# Patient Record
Sex: Female | Born: 1980
Health system: Southern US, Community
[De-identification: ages and names within clinical notes are randomized; demographics above are authoritative.]

## PROBLEM LIST (undated history)

## (undated) DIAGNOSIS — N200 Calculus of kidney: Secondary | ICD-10-CM

## (undated) DIAGNOSIS — IMO0002 Reserved for concepts with insufficient information to code with codable children: Secondary | ICD-10-CM

## (undated) DIAGNOSIS — F329 Major depressive disorder, single episode, unspecified: Secondary | ICD-10-CM

## (undated) DIAGNOSIS — N390 Urinary tract infection, site not specified: Secondary | ICD-10-CM

## (undated) DIAGNOSIS — F988 Other specified behavioral and emotional disorders with onset usually occurring in childhood and adolescence: Secondary | ICD-10-CM

## (undated) DIAGNOSIS — R87619 Unspecified abnormal cytological findings in specimens from cervix uteri: Secondary | ICD-10-CM

## (undated) DIAGNOSIS — I8390 Asymptomatic varicose veins of unspecified lower extremity: Secondary | ICD-10-CM

## (undated) DIAGNOSIS — F32A Depression, unspecified: Secondary | ICD-10-CM

## (undated) HISTORY — DX: Asymptomatic varicose veins of unspecified lower extremity: I83.90

## (undated) HISTORY — PX: COLPOSCOPY: SHX161

---

## 2011-02-05 ENCOUNTER — Encounter: Payer: Self-pay | Admitting: *Deleted

## 2011-02-05 ENCOUNTER — Emergency Department (HOSPITAL_COMMUNITY)
Admission: EM | Admit: 2011-02-05 | Discharge: 2011-02-05 | Disposition: A | Discharge status: Home / Self Care | Payer: Medicaid Other | Attending: Emergency Medicine | Admitting: Emergency Medicine

## 2011-02-05 DIAGNOSIS — R059 Cough, unspecified: Secondary | ICD-10-CM

## 2011-02-05 DIAGNOSIS — R05 Cough: Secondary | ICD-10-CM

## 2011-02-05 DIAGNOSIS — L299 Pruritus, unspecified: Secondary | ICD-10-CM | POA: Insufficient documentation

## 2011-02-05 DIAGNOSIS — B86 Scabies: Secondary | ICD-10-CM | POA: Insufficient documentation

## 2011-02-05 DIAGNOSIS — Z79899 Other long term (current) drug therapy: Secondary | ICD-10-CM | POA: Insufficient documentation

## 2011-02-05 MED ORDER — PERMETHRIN 5 % EX CREA: TOPICAL_CREAM | CUTANEOUS | Status: AC

## 2011-02-05 MED ORDER — BENZONATATE 200 MG PO CAPS: 200.0000 mg | ORAL_CAPSULE | Freq: Three times a day (TID) | ORAL | Status: AC

## 2011-02-05 NOTE — ED Notes (Signed)
Pt states that she was working with a coworker who had scabies, was exposed for two weeks, started itching to both hands and ankles area a few days ago, also c/o cough, pt states that she was sick with the flu last week, cough was productive but has changed to non productive dry cough now.

## 2011-02-06 NOTE — ED Provider Notes (Signed)
History     CSN: [id number]  Arrival date & time 02/05/11  1115   First MD Initiated Contact with Patient 02/05/11 1227      Chief Complaint  Patient presents with  . Rash  . Cough    (Consider location/radiation/quality/duration/timing/severity/associated sxs/prior treatment) Patient is a 31 y.o. female presenting with rash and cough. The history is provided by the patient.  Rash  This is a new problem. The current episode started more than 2 days ago. The problem has not changed since onset.Associated with: Patient recently discovered a coworker had scabies,  and pt has been exposed to her for the past 2 weeks at work. There has been no fever. The rash is present on the left wrist, right wrist, right fingers, left fingers, right ankle and left ankle. The pain is at a severity of 0/10. The patient is experiencing no pain. The pain has been constant since onset. Associated symptoms include itching. She has tried nothing for the symptoms.  Cough This is a new problem. The current episode started more than 1 week ago. The problem occurs every few minutes. The problem has not changed since onset.The cough is non-productive (she reports having the "flu" last week,  her symptoms have resolved except for continued dry cough.). Pertinent negatives include no chest pain, no headaches, no rhinorrhea, no sore throat, no myalgias, no shortness of breath and no wheezing. She has tried cough syrup for the symptoms. The treatment provided no relief. She is not a smoker.    History reviewed. No pertinent past medical history.  History reviewed. No pertinent past surgical history.  History reviewed. No pertinent family history.  History  Substance Use Topics  . Smoking status: Former Games developer  . Smokeless tobacco: Not on file  . Alcohol Use: No    OB History    Grav Para Term Preterm Abortions TAB SAB Ect Mult Living                  Review of Systems  Constitutional: Negative for fever.    HENT: Negative for congestion, sore throat, rhinorrhea and neck pain.   Eyes: Negative.   Respiratory: Positive for cough. Negative for chest tightness, shortness of breath and wheezing.   Cardiovascular: Negative for chest pain.  Gastrointestinal: Negative for nausea and abdominal pain.  Genitourinary: Negative.   Musculoskeletal: Negative for myalgias, joint swelling and arthralgias.  Skin: Positive for itching and rash. Negative for wound.  Neurological: Negative for dizziness, weakness, light-headedness, numbness and headaches.  Hematological: Negative.   Psychiatric/Behavioral: Negative.     Allergies  Latex  Home Medications   Current Outpatient Rx  Name Route Sig Dispense Refill  . BENZONATATE 200 MG PO CAPS Oral Take 1 capsule (200 mg total) by mouth every 8 (eight) hours. 20 capsule 0  . PERMETHRIN 5 % EX CREA  Apply to affected area once 60 g 1    BP 108/46  Pulse 77  Temp 98.1 F (36.7 C)  Resp 20  Ht 5\' 6"  (1.676 m)  Wt 129 lb (58.514 kg)  BMI 20.82 kg/m2  SpO2 98%  LMP 02/04/2011  Physical Exam  Nursing note and vitals reviewed. Constitutional: She is oriented to person, place, and time. She appears well-developed and well-nourished.  HENT:  Head: Normocephalic and atraumatic.  Eyes: Conjunctivae are normal.  Neck: Normal range of motion.  Cardiovascular: Normal rate, regular rhythm, normal heart sounds and intact distal pulses.   Pulmonary/Chest: Effort normal and breath sounds normal.  She has no wheezes.  Abdominal: Soft. Bowel sounds are normal. There is no tenderness.  Musculoskeletal: Normal range of motion.  Neurological: She is alert and oriented to person, place, and time.  Skin: Skin is warm and dry. Rash noted. Rash is papular.       Scattered papules, few in linear pattern on hands,  Finger webs,  Bilateral ankles and popliteal space bilateral.  Extension excoriation with scabbing present,  Particularly between fingers.  No drainage,  No  erythema or edema noted.  Psychiatric: She has a normal mood and affect.    ED Course  Procedures (including critical care time)  Labs Reviewed - No data to display No results found.   1. Scabies   2. Cough       MDM   Permethrin.  Shaune Pascal, [place] 02/06/11 [phone]

## 2011-02-07 NOTE — ED Provider Notes (Signed)
Medical screening examination/treatment/procedure(s) were performed by non-physician practitioner and as supervising physician I was immediately available for consultation/collaboration.   Joya Gaskins, MD 02/07/11 [phone]

## 2011-09-26 ENCOUNTER — Encounter (HOSPITAL_COMMUNITY): Payer: Self-pay | Admitting: *Deleted

## 2011-09-26 ENCOUNTER — Inpatient Hospital Stay (HOSPITAL_COMMUNITY)
Admission: AD | Admit: 2011-09-26 | Discharge: 2011-09-26 | Disposition: A | Discharge status: Home / Self Care | Payer: Medicaid Other | Source: Ambulatory Visit | Attending: Obstetrics and Gynecology | Admitting: Obstetrics and Gynecology

## 2011-09-26 DIAGNOSIS — O009 Unspecified ectopic pregnancy without intrauterine pregnancy: Secondary | ICD-10-CM

## 2011-09-26 DIAGNOSIS — O00109 Unspecified tubal pregnancy without intrauterine pregnancy: Secondary | ICD-10-CM | POA: Insufficient documentation

## 2011-09-26 HISTORY — DX: Major depressive disorder, single episode, unspecified: F32.9

## 2011-09-26 HISTORY — DX: Calculus of kidney: N20.0

## 2011-09-26 HISTORY — DX: Reserved for concepts with insufficient information to code with codable children: IMO0002

## 2011-09-26 HISTORY — DX: Depression, unspecified: F32.A

## 2011-09-26 HISTORY — DX: Unspecified abnormal cytological findings in specimens from cervix uteri: R87.619

## 2011-09-26 HISTORY — DX: Other specified behavioral and emotional disorders with onset usually occurring in childhood and adolescence: F98.8

## 2011-09-26 HISTORY — DX: Urinary tract infection, site not specified: N39.0

## 2011-09-26 LAB — CBC WITH DIFFERENTIAL/PLATELET
Basophils Absolute: 0 10*3/uL (ref 0.0–0.1)
Basophils Relative: 0 % (ref 0–1)
Eosinophils Absolute: 0.1 10*3/uL (ref 0.0–0.7)
MCH: 30.3 pg (ref 26.0–34.0)
MCHC: 33.8 g/dL (ref 30.0–36.0)
Monocytes Relative: 6 % (ref 3–12)
Neutro Abs: 4.7 10*3/uL (ref 1.7–7.7)
Neutrophils Relative %: 65 % (ref 43–77)
Platelets: 238 10*3/uL (ref 150–400)
RDW: 12.5 % (ref 11.5–15.5)

## 2011-09-26 LAB — ABO/RH: ABO/RH(D): O POS

## 2011-09-26 LAB — HCG, QUANTITATIVE, PREGNANCY: hCG, Beta Chain, Quant, S: 901 m[IU]/mL — ABNORMAL HIGH (ref <5)

## 2011-09-26 LAB — BUN: BUN: 11 mg/dL (ref 6–23)

## 2011-09-26 LAB — AST: AST: 15 U/L (ref 0–37)

## 2011-09-26 MED ORDER — METHOTREXATE INJECTION FOR WOMEN'S HOSPITAL
50.0000 mg/m2 | Freq: Once | INTRAMUSCULAR | Status: AC
  Administered 2011-09-26: 85 mg via INTRAMUSCULAR
  Filled 2011-09-26: qty 1.7

## 2011-09-26 MED ORDER — HYDROCODONE-ACETAMINOPHEN 5-325 MG PO TABS: 2.0000 | ORAL_TABLET | ORAL | Status: AC | PRN

## 2011-09-26 NOTE — MAU Note (Signed)
Sent for methotrexate.  Bleeding x2+wks. Found out preg 3-4 wks ago, started bleeding a wk later and then the pain started.  Levels being followed at dr's office.  Had [place] today, dx with ectopic preg- right.

## 2011-09-26 NOTE — MAU Provider Note (Signed)
History     CSN: [id number]  Arrival date and time: 09/26/11 1800   First Provider Initiated Contact with Patient 09/26/11 1823      Chief Complaint  Patient presents with  . Ectopic Pregnancy   HPI Laura Kemp 31 y.o. Sent from [facility] with a diagnosed Right ectopic pregnancy 2.4 cm with no free fluid on ultrasound today.  Has been followed with quants in the office.  Needs methotrexate today.  Records from the office - Blood type O+ 09-10-11    697 09-12-11    805 09-14-11  1173 09-20-11    932  OB History    Grav Para Term Preterm Abortions TAB SAB Ect Mult Living   2 1 1  1   1  1       Past Medical History  Diagnosis Date  . Kidney stones   . Abnormal Pap smear   . Depression   . UTI (lower urinary tract infection)   . ADD (attention deficit disorder)     Past Surgical History  Procedure Date  . Colposcopy     History reviewed. No pertinent family history.  History  Substance Use Topics  . Smoking status: Former Games developer  . Smokeless tobacco: Not on file  . Alcohol Use: No    Allergies:  Allergies  Allergen Reactions  . Latex     No prescriptions prior to admission    Review of Systems  Gastrointestinal: Positive for abdominal pain. Negative for nausea and vomiting.  Genitourinary:       Periodic vaginal bleeding   Physical Exam   Blood pressure 124/64, pulse 102, temperature 98.3 F (36.8 C), temperature source Oral, resp. rate 20, height 5\' 5"  (1.651 m), weight 134 lb (60.782 kg), last menstrual period 08/01/2011, unknown if currently breastfeeding.  Physical Exam  Nursing note and vitals reviewed. Constitutional: She is oriented to person, place, and time. She appears well-developed and well-nourished.  HENT:  Head: Normocephalic.  Eyes: EOM are normal.  Neck: Neck supple.  Musculoskeletal: Normal range of motion.  Neurological: She is alert and oriented to person, place, and time.  Skin: Skin is warm and dry.  Psychiatric: She has  a normal mood and affect.    MAU Course  Procedures  MDM Results for orders placed during the hospital encounter of 09/26/11 (from the past 24 hour(s))  HCG, QUANTITATIVE, PREGNANCY     Status: Abnormal   Collection Time   09/26/11  6:15 PM      Component Value Range   hCG, Beta Chain, Quant, S 901 (*) <5 mIU/mL  CBC WITH DIFFERENTIAL     Status: Abnormal   Collection Time   09/26/11  6:15 PM      Component Value Range   WBC 7.3  4.0 - 10.5 K/uL   RBC 4.00  3.87 - 5.11 MIL/uL   Hemoglobin 12.1  12.0 - 15.0 g/dL   HCT 40.9 (*) 81.1 - 91.4 %   MCV 89.5  78.0 - 100.0 fL   MCH 30.3  26.0 - 34.0 pg   MCHC 33.8  30.0 - 36.0 g/dL   RDW 78.2  95.6 - 21.3 %   Platelets 238  150 - 400 K/uL   Neutrophils Relative 65  43 - 77 %   Neutro Abs 4.7  1.7 - 7.7 K/uL   Lymphocytes Relative 29  12 - 46 %   Lymphs Abs 2.1  0.7 - 4.0 K/uL   Monocytes Relative 6  3 -  12 %   Monocytes Absolute 0.4  0.1 - 1.0 K/uL   Eosinophils Relative 1  0 - 5 %   Eosinophils Absolute 0.1  0.0 - 0.7 K/uL   Basophils Relative 0  0 - 1 %   Basophils Absolute 0.0  0.0 - 0.1 K/uL  AST     Status: Normal   Collection Time   09/26/11  6:15 PM      Component Value Range   AST 15  0 - 37 U/L  BUN     Status: Normal   Collection Time   09/26/11  6:15 PM      Component Value Range   BUN 11  6 - 23 mg/dL  CREATININE, SERUM     Status: Normal   Collection Time   09/26/11  6:15 PM      Component Value Range   Creatinine, Ser 0.62  0.50 - 1.10 mg/dL   GFR calc non Af Amer >90  >90 mL/min   GFR calc Af Amer >90  >90 mL/min     Assessment and Plan  Right ectopic pregnancy  Plan Per Dr. Despina Hidden, will give Methotrexate and will have follow up quants done in the office - has appointments. Pelvic rest and Ectopic precautions discussed. Advised to return with severe bleeding or severe pain Discussed with Dr. Emelda Fear - do not fill Toradol prescription - instead use Vicodin 1-2 PO q 4-6 hours PRN for pain (#30) no  refills. BURLESON,TERRI 09/26/2011, 6:46 PM

## 2011-09-27 NOTE — MAU Provider Note (Signed)
Attestation of Attending Supervision of Advanced Practitioner: Evaluation and management procedures were performed by the PA/NP/CNM/OB Fellow under my supervision/collaboration. Chart reviewed and agree with management and plan.  Laura Kemp V 09/27/2011 4:31 AM

## 2011-09-28 ENCOUNTER — Ambulatory Visit (HOSPITAL_COMMUNITY): Payer: Medicaid Other

## 2011-12-13 ENCOUNTER — Encounter (HOSPITAL_COMMUNITY): Payer: Self-pay | Admitting: *Deleted

## 2011-12-13 ENCOUNTER — Emergency Department (HOSPITAL_COMMUNITY)
Admission: EM | Admit: 2011-12-13 | Discharge: 2011-12-13 | Disposition: A | Discharge status: Home / Self Care | Payer: Medicaid Other | Attending: Emergency Medicine | Admitting: Emergency Medicine

## 2011-12-13 ENCOUNTER — Emergency Department (HOSPITAL_COMMUNITY): Payer: Medicaid Other

## 2011-12-13 DIAGNOSIS — M4306 Spondylolysis, lumbar region: Secondary | ICD-10-CM

## 2011-12-13 DIAGNOSIS — M545 Low back pain, unspecified: Secondary | ICD-10-CM | POA: Insufficient documentation

## 2011-12-13 DIAGNOSIS — Z87442 Personal history of urinary calculi: Secondary | ICD-10-CM | POA: Insufficient documentation

## 2011-12-13 DIAGNOSIS — F329 Major depressive disorder, single episode, unspecified: Secondary | ICD-10-CM | POA: Insufficient documentation

## 2011-12-13 DIAGNOSIS — Z79899 Other long term (current) drug therapy: Secondary | ICD-10-CM | POA: Insufficient documentation

## 2011-12-13 DIAGNOSIS — M47817 Spondylosis without myelopathy or radiculopathy, lumbosacral region: Secondary | ICD-10-CM | POA: Insufficient documentation

## 2011-12-13 DIAGNOSIS — F3289 Other specified depressive episodes: Secondary | ICD-10-CM | POA: Insufficient documentation

## 2011-12-13 DIAGNOSIS — Z87440 Personal history of urinary (tract) infections: Secondary | ICD-10-CM | POA: Insufficient documentation

## 2011-12-13 DIAGNOSIS — F988 Other specified behavioral and emotional disorders with onset usually occurring in childhood and adolescence: Secondary | ICD-10-CM | POA: Insufficient documentation

## 2011-12-13 DIAGNOSIS — Z87891 Personal history of nicotine dependence: Secondary | ICD-10-CM | POA: Insufficient documentation

## 2011-12-13 MED ORDER — METHOCARBAMOL 500 MG PO TABS: 500.0000 mg | ORAL_TABLET | Freq: Three times a day (TID) | ORAL | Status: DC

## 2011-12-13 MED ORDER — HYDROCODONE-ACETAMINOPHEN 5-325 MG PO TABS: ORAL_TABLET | ORAL | Status: DC

## 2011-12-13 MED ORDER — DEXAMETHASONE 6 MG PO TABS: ORAL_TABLET | ORAL | Status: DC

## 2011-12-13 NOTE — ED Notes (Signed)
C/o lower back pain after bending over last night.

## 2011-12-13 NOTE — ED Provider Notes (Signed)
History     CSN: [id number]  Arrival date & time 12/13/11  1555   First MD Initiated Contact with Patient 12/13/11 1626      Chief Complaint  Patient presents with  . Back Pain    (Consider location/radiation/quality/duration/timing/severity/associated sxs/prior treatment) HPI Comments: Patient states she bent over with her laptop while getting in a chair on last evening and this sustained severe pain in her lower back. Patient states she had to sit for a few minutes before she can even move. Today she continues to have pain in her lower back and presents to the emergency department for evaluation. There's been no loss of bowel or bladder function. There's been no falls. Patient tried an NSAID but states this has not helped at all.  Patient is a 31 y.o. female presenting with back pain. The history is provided by the patient.  Back Pain  This is a new problem. The current episode started yesterday. The problem occurs constantly. The problem has not changed since onset.Associated with: Pt bent over and is having increase pain of he back. The pain is present in the lumbar spine. The quality of the pain is described as aching. The pain does not radiate. The pain is moderate. The symptoms are aggravated by certain positions. The pain is the same all the time. Pertinent negatives include no chest pain, no fever, no abdominal pain, no bowel incontinence, no perianal numbness, no bladder incontinence and no dysuria. She has tried nothing for the symptoms.    Past Medical History  Diagnosis Date  . Kidney stones   . Abnormal Pap smear   . Depression   . UTI (lower urinary tract infection)   . ADD (attention deficit disorder)     Past Surgical History  Procedure Date  . Colposcopy     No family history on file.  History  Substance Use Topics  . Smoking status: Former Games developer  . Smokeless tobacco: Not on file  . Alcohol Use: No    OB History    Grav Para Term Preterm Abortions TAB  SAB Ect Mult Living   2 1 1  1   1  1       Review of Systems  Constitutional: Negative for fever and activity change.       All ROS Neg except as noted in HPI  HENT: Negative for nosebleeds and neck pain.   Eyes: Negative for photophobia and discharge.  Respiratory: Negative for cough, shortness of breath and wheezing.   Cardiovascular: Negative for chest pain and palpitations.  Gastrointestinal: Negative for abdominal pain, blood in stool and bowel incontinence.  Genitourinary: Negative for bladder incontinence, dysuria, frequency and hematuria.  Musculoskeletal: Positive for back pain. Negative for arthralgias.  Skin: Negative.   Neurological: Negative for dizziness, seizures and speech difficulty.  Psychiatric/Behavioral: Negative for hallucinations and confusion.    Allergies  Latex  Home Medications   Current Outpatient Rx  Name  Route  Sig  Dispense  Refill  . BUSPIRONE HCL 5 MG PO TABS   Oral   Take 5 mg by mouth daily as needed. For anxiety         . LISDEXAMFETAMINE DIMESYLATE 30 MG PO CAPS   Oral   Take 30 mg by mouth every morning. ADD         . ORTHO TRI-CYCLEN (28) PO   Oral   Take 1 tablet by mouth every evening.           BP  102/63  Pulse 70  Temp 97.8 F (36.6 C) (Oral)  Resp 20  Ht 5\' 6"  (1.676 m)  Wt 135 lb (61.236 kg)  BMI 21.79 kg/m2  SpO2 100%  LMP 12/12/2011  Breastfeeding? No  Physical Exam  Nursing note and vitals reviewed. Constitutional: She is oriented to person, place, and time. She appears well-developed and well-nourished.  Non-toxic appearance.  HENT:  Head: Normocephalic.  Right Ear: Tympanic membrane and external ear normal.  Left Ear: Tympanic membrane and external ear normal.  Eyes: EOM and lids are normal. Pupils are equal, round, and reactive to light.  Neck: Normal range of motion. Neck supple. Carotid bruit is not present.  Cardiovascular: Normal rate, regular rhythm, normal heart sounds, intact distal pulses  and normal pulses.   Pulmonary/Chest: Breath sounds normal. No respiratory distress.  Abdominal: Soft. Bowel sounds are normal. There is no tenderness. There is no guarding.  Musculoskeletal: Normal range of motion.       Pain to change of position and range of motion of the lower lumbar area. The area is not hot. There is no palpable deformity. There is some mild paraspinal spasm present.  Lymphadenopathy:       Head (right side): No submandibular adenopathy present.       Head (left side): No submandibular adenopathy present.    She has no cervical adenopathy.  Neurological: She is alert and oriented to person, place, and time. She has normal strength. No cranial nerve deficit or sensory deficit.  Skin: Skin is warm and dry.  Psychiatric: She has a normal mood and affect. Her speech is normal.    ED Course  Procedures (including critical care time)  Labs Reviewed - No data to display No results found.   No diagnosis found.    MDM  I have reviewed nursing notes, vital signs, and all appropriate lab and imaging results for this patient. The x-ray of the lumbar spine reveals a grade 1 anterolisthesis L5 on S1, related to bilateral L5 spondylolysis. Patient states that she will see a chiropractic physician for some alignment and manipulation to assist with her pain. Patient also advised to see an orthopedic physician for long-term planning concerning her back and these x-ray results. Prescription for dexamethasone, Robaxin, and Norco 15 tablets one at at bedtime or every 6 hours given to the patient.       Kathie Dike, [place] 12/13/11 [phone]

## 2011-12-14 NOTE — ED Provider Notes (Signed)
History/physical exam/procedure(s) were performed by non-physician practitioner and as supervising physician I was immediately available for consultation/collaboration. I have reviewed all notes and am in agreement with care and plan.   Olanrewaju Osborn S Aaleyah Witherow, MD 12/14/11 1513 

## 2012-04-06 ENCOUNTER — Emergency Department (HOSPITAL_COMMUNITY)
Admission: EM | Admit: 2012-04-06 | Discharge: 2012-04-06 | Disposition: A | Discharge status: Home / Self Care | Payer: Medicaid Other | Attending: Emergency Medicine | Admitting: Emergency Medicine

## 2012-04-06 ENCOUNTER — Encounter (HOSPITAL_COMMUNITY): Payer: Self-pay

## 2012-04-06 DIAGNOSIS — J3489 Other specified disorders of nose and nasal sinuses: Secondary | ICD-10-CM | POA: Insufficient documentation

## 2012-04-06 DIAGNOSIS — F988 Other specified behavioral and emotional disorders with onset usually occurring in childhood and adolescence: Secondary | ICD-10-CM | POA: Insufficient documentation

## 2012-04-06 DIAGNOSIS — F3289 Other specified depressive episodes: Secondary | ICD-10-CM | POA: Insufficient documentation

## 2012-04-06 DIAGNOSIS — J029 Acute pharyngitis, unspecified: Secondary | ICD-10-CM | POA: Insufficient documentation

## 2012-04-06 DIAGNOSIS — Z87440 Personal history of urinary (tract) infections: Secondary | ICD-10-CM | POA: Insufficient documentation

## 2012-04-06 DIAGNOSIS — R51 Headache: Secondary | ICD-10-CM | POA: Insufficient documentation

## 2012-04-06 DIAGNOSIS — Z79899 Other long term (current) drug therapy: Secondary | ICD-10-CM | POA: Insufficient documentation

## 2012-04-06 DIAGNOSIS — R509 Fever, unspecified: Secondary | ICD-10-CM | POA: Insufficient documentation

## 2012-04-06 DIAGNOSIS — Z87442 Personal history of urinary calculi: Secondary | ICD-10-CM | POA: Insufficient documentation

## 2012-04-06 DIAGNOSIS — F329 Major depressive disorder, single episode, unspecified: Secondary | ICD-10-CM | POA: Insufficient documentation

## 2012-04-06 MED ORDER — HYDROCODONE-ACETAMINOPHEN 5-325 MG PO TABS: 1.0000 | ORAL_TABLET | ORAL | Status: DC | PRN

## 2012-04-06 NOTE — ED Notes (Signed)
Pt presents with sore throat, URI symptoms x 4 days. Denies fever, SOB and productive cough at this time. Pt reports difficulty swallowing certain foods, encouraged pt to eat soft foods for a few days and gradually work back to a regular diet. NAD noted.

## 2012-04-06 NOTE — ED Provider Notes (Signed)
History     CSN: [id number]  Arrival date & time 04/06/12  1452   First MD Initiated Contact with Patient 04/06/12 1747      Chief Complaint  Patient presents with  . Sore Throat    (Consider location/radiation/quality/duration/timing/severity/associated sxs/prior treatment) Patient is a 32 y.o. female presenting with pharyngitis. The history is provided by the patient.  Sore Throat This is a new problem. The current episode started yesterday. The problem occurs constantly. The problem has been unchanged. Associated symptoms include chills, congestion, coughing, a fever, headaches and a sore throat. Pertinent negatives include no abdominal pain, arthralgias, chest pain, joint swelling, myalgias, nausea, neck pain, numbness, rash, vomiting or weakness. Associated symptoms comments: Nasal congestion has been clear rhinnorhea . Nothing aggravates the symptoms. She has tried acetaminophen (she also took one leftover penicillin tablet today.) for the symptoms. The treatment provided no relief.    Past Medical History  Diagnosis Date  . Kidney stones   . Abnormal Pap smear   . Depression   . UTI (lower urinary tract infection)   . ADD (attention deficit disorder)     Past Surgical History  Procedure Laterality Date  . Colposcopy      No family history on file.  History  Substance Use Topics  . Smoking status: Former Games developer  . Smokeless tobacco: Not on file  . Alcohol Use: No    OB History   Grav Para Term Preterm Abortions TAB SAB Ect Mult Living   2 1 1  1   1  1       Review of Systems  Constitutional: Positive for fever and chills.  HENT: Positive for congestion, sore throat and rhinorrhea. Negative for ear pain, facial swelling, trouble swallowing and neck pain.   Eyes: Negative.   Respiratory: Positive for cough. Negative for chest tightness and shortness of breath.   Cardiovascular: Negative for chest pain.  Gastrointestinal: Negative for nausea, vomiting and  abdominal pain.  Genitourinary: Negative.   Musculoskeletal: Negative for myalgias, joint swelling and arthralgias.  Skin: Negative.  Negative for rash and wound.  Neurological: Positive for headaches. Negative for dizziness, weakness, light-headedness and numbness.  Psychiatric/Behavioral: Negative.     Allergies  Latex  Home Medications   Current Outpatient Rx  Name  Route  Sig  Dispense  Refill  . busPIRone (BUSPAR) 5 MG tablet   Oral   Take 5 mg by mouth daily as needed. For anxiety         . dexamethasone (DECADRON) 6 MG tablet      1 po daily with food   6 tablet   0   . HYDROcodone-acetaminophen (NORCO) 5-325 MG per tablet      1 at hs or q6h prn pain   15 tablet   0   . HYDROcodone-acetaminophen (NORCO/VICODIN) 5-325 MG per tablet   Oral   Take 1 tablet by mouth every 4 (four) hours as needed for pain.   15 tablet   0   . lisdexamfetamine (VYVANSE) 30 MG capsule   Oral   Take 30 mg by mouth every morning. ADD         . methocarbamol (ROBAXIN) 500 MG tablet   Oral   Take 1 tablet (500 mg total) by mouth 3 (three) times daily.   21 tablet   0   . Norgestim-Eth Estrad Triphasic (ORTHO TRI-CYCLEN, 28, PO)   Oral   Take 1 tablet by mouth every evening.  BP 99/48  Pulse 83  Temp(Src) 98.1 F (36.7 C) (Oral)  Resp 24  Ht 5\' 7"  (1.702 m)  Wt 139 lb (63.05 kg)  BMI 21.77 kg/m2  SpO2 100%  LMP 03/16/2012  Physical Exam  Constitutional: She is oriented to person, place, and time. She appears well-developed and well-nourished.  HENT:  Head: Normocephalic and atraumatic.  Right Ear: Tympanic membrane and ear canal normal.  Left Ear: Tympanic membrane and ear canal normal.  Nose: Mucosal edema and rhinorrhea present.  Mouth/Throat: Uvula is midline and mucous membranes are normal. Posterior oropharyngeal edema present. No oropharyngeal exudate, posterior oropharyngeal erythema or tonsillar abscesses.  Eyes: Conjunctivae are normal.   Cardiovascular: Normal rate and normal heart sounds.   Pulmonary/Chest: Effort normal. No respiratory distress. She has no wheezes. She has no rales.  Abdominal: Soft. There is no tenderness.  Musculoskeletal: Normal range of motion.  Neurological: She is alert and oriented to person, place, and time.  Skin: Skin is warm and dry. No rash noted.  Psychiatric: She has a normal mood and affect.    ED Course  Procedures (including critical care time)  Labs Reviewed  RAPID STREP SCREEN   No results found.   1. Viral pharyngitis       MDM  Patients labs and/or radiological studies were reviewed during the medical decision making and disposition process.  Pt with sx and exam c/w viral uri.  Encouraged rest, fluids,  Prescribed hydrocodone for cough suppression,  Sore throat, headache.  The patient appears reasonably screened and/or stabilized for discharge and I doubt any other medical condition or other [facility] requiring further screening, evaluation, or treatment in the ED at this time prior to discharge.         Burgess Amor, PA-C 04/06/12 1820

## 2012-04-06 NOTE — ED Notes (Signed)
Pt reports being sick with sore throat and congestion. Has been taking tylenol but no other otc meds.

## 2012-04-08 NOTE — ED Provider Notes (Signed)
Medical screening examination/treatment/procedure(s) were performed by non-physician practitioner and as supervising physician I was immediately available for consultation/collaboration.  Evyn Kooyman W. Ortha Metts, MD 04/08/12 1137 

## 2013-02-13 ENCOUNTER — Emergency Department (HOSPITAL_COMMUNITY): Payer: Medicaid Other

## 2013-02-13 ENCOUNTER — Encounter (HOSPITAL_COMMUNITY): Payer: Self-pay | Admitting: Emergency Medicine

## 2013-02-13 ENCOUNTER — Emergency Department (HOSPITAL_COMMUNITY)
Admission: EM | Admit: 2013-02-13 | Discharge: 2013-02-13 | Disposition: A | Discharge status: Home / Self Care | Payer: Medicaid Other | Attending: Emergency Medicine | Admitting: Emergency Medicine

## 2013-02-13 DIAGNOSIS — Z87891 Personal history of nicotine dependence: Secondary | ICD-10-CM | POA: Insufficient documentation

## 2013-02-13 DIAGNOSIS — S39012A Strain of muscle, fascia and tendon of lower back, initial encounter: Secondary | ICD-10-CM

## 2013-02-13 DIAGNOSIS — F329 Major depressive disorder, single episode, unspecified: Secondary | ICD-10-CM | POA: Insufficient documentation

## 2013-02-13 DIAGNOSIS — X500XXA Overexertion from strenuous movement or load, initial encounter: Secondary | ICD-10-CM | POA: Insufficient documentation

## 2013-02-13 DIAGNOSIS — F3289 Other specified depressive episodes: Secondary | ICD-10-CM | POA: Insufficient documentation

## 2013-02-13 DIAGNOSIS — Z91040 Latex allergy status: Secondary | ICD-10-CM | POA: Insufficient documentation

## 2013-02-13 DIAGNOSIS — F988 Other specified behavioral and emotional disorders with onset usually occurring in childhood and adolescence: Secondary | ICD-10-CM | POA: Insufficient documentation

## 2013-02-13 DIAGNOSIS — Z79899 Other long term (current) drug therapy: Secondary | ICD-10-CM | POA: Insufficient documentation

## 2013-02-13 DIAGNOSIS — Y929 Unspecified place or not applicable: Secondary | ICD-10-CM | POA: Insufficient documentation

## 2013-02-13 DIAGNOSIS — Y9389 Activity, other specified: Secondary | ICD-10-CM | POA: Insufficient documentation

## 2013-02-13 DIAGNOSIS — Z87440 Personal history of urinary (tract) infections: Secondary | ICD-10-CM | POA: Insufficient documentation

## 2013-02-13 DIAGNOSIS — Z87442 Personal history of urinary calculi: Secondary | ICD-10-CM | POA: Insufficient documentation

## 2013-02-13 DIAGNOSIS — IMO0002 Reserved for concepts with insufficient information to code with codable children: Secondary | ICD-10-CM | POA: Insufficient documentation

## 2013-02-13 DIAGNOSIS — S335XXA Sprain of ligaments of lumbar spine, initial encounter: Secondary | ICD-10-CM | POA: Insufficient documentation

## 2013-02-13 MED ORDER — METHOCARBAMOL 500 MG PO TABS
1000.0000 mg | ORAL_TABLET | Freq: Once | ORAL | Status: AC
  Administered 2013-02-13: 1000 mg via ORAL
  Filled 2013-02-13: qty 2

## 2013-02-13 MED ORDER — HYDROCODONE-ACETAMINOPHEN 5-325 MG PO TABS
1.0000 | ORAL_TABLET | Freq: Once | ORAL | Status: AC
  Administered 2013-02-13: 1 via ORAL
  Filled 2013-02-13: qty 1

## 2013-02-13 MED ORDER — PREDNISONE 20 MG PO TABS: ORAL_TABLET | ORAL | Status: DC

## 2013-02-13 MED ORDER — HYDROCODONE-ACETAMINOPHEN 5-325 MG PO TABS: ORAL_TABLET | ORAL | Status: DC

## 2013-02-13 MED ORDER — METHOCARBAMOL 500 MG PO TABS: 1000.0000 mg | ORAL_TABLET | Freq: Three times a day (TID) | ORAL | Status: DC

## 2013-02-13 NOTE — Discharge Instructions (Signed)
Lumbosacral Strain Lumbosacral strain is a strain of any of the parts that make up your lumbosacral vertebrae. Your lumbosacral vertebrae are the bones that make up the lower third of your backbone. Your lumbosacral vertebrae are held together by muscles and tough, fibrous tissue (ligaments).  CAUSES  A sudden blow to your back can cause lumbosacral strain. Also, anything that causes an excessive stretch of the muscles in the low back can cause this strain. This is typically seen when people exert themselves strenuously, fall, lift heavy objects, bend, or crouch repeatedly. RISK FACTORS  Physically demanding work.  Participation in pushing or pulling sports or sports that require sudden twist of the back (tennis, golf, baseball).  Weight lifting.  Excessive lower back curvature.  Forward-tilted pelvis.  Weak back or abdominal muscles or both.  Tight hamstrings. SIGNS AND SYMPTOMS  Lumbosacral strain may cause pain in the area of your injury or pain that moves (radiates) down your leg.  DIAGNOSIS Your health care provider can often diagnose lumbosacral strain through a physical exam. In some cases, you may need tests such as X-ray exams.  TREATMENT  Treatment for your lower back injury depends on many factors that your clinician will have to evaluate. However, most treatment will include the use of anti-inflammatory medicines. HOME CARE INSTRUCTIONS   Avoid hard physical activities (tennis, racquetball, waterskiing) if you are not in proper physical condition for it. This may aggravate or create problems.  If you have a back problem, avoid sports requiring sudden body movements. Swimming and walking are generally safer activities.  Maintain good posture.  Maintain a healthy weight.  For acute conditions, you may put ice on the injured area.  Put ice in a plastic bag.  Place a towel between your skin and the bag.  Leave the ice on for 20 minutes, 2 3 times a day.  When the  low back starts healing, stretching and strengthening exercises may be recommended. SEEK MEDICAL CARE IF:  Your back pain is getting worse.  You experience severe back pain not relieved with medicines. SEEK IMMEDIATE MEDICAL CARE IF:   You have numbness, tingling, weakness, or problems with the use of your arms or legs.  There is a change in bowel or bladder control.  You have increasing pain in any area of the body, including your belly (abdomen).  You notice shortness of breath, dizziness, or feel faint.  You feel sick to your stomach (nauseous), are throwing up (vomiting), or become sweaty.  You notice discoloration of your toes or legs, or your feet get very cold. MAKE SURE YOU:   Understand these instructions.  Will watch your condition.  Will get help right away if you are not doing well or get worse. Document Released: 10/25/2004 Document Revised: 11/05/2012 Document Reviewed: 09/03/2012 [employer] Patient Information 2014 [employer].  

## 2013-02-13 NOTE — ED Notes (Signed)
Pt went to pick up her dog this morning and her back "went out." Pt requesting xray and steroids.

## 2013-02-15 NOTE — ED Provider Notes (Signed)
CSN: [id number]     Arrival date & time 02/13/13  2014 History   First MD Initiated Contact with Patient 02/13/13 2032     Chief Complaint  Patient presents with  . Back Pain   (Consider location/radiation/quality/duration/timing/severity/associated sxs/prior Treatment) Patient is a 33 y.o. female presenting with back pain.  Back Pain Location:  Lumbar spine Quality:  Aching and shooting Radiates to:  L thigh, L posterior upper leg and L knee Pain severity:  Moderate Pain is:  Same all the time Onset quality:  Sudden Duration:  12 hours Timing:  Constant Progression:  Unchanged Chronicity:  New Context: lifting heavy objects and twisting   Relieved by:  Nothing Worsened by:  Bending, twisting, standing and movement Ineffective treatments:  OTC medications Associated symptoms: leg pain   Associated symptoms: no abdominal pain, no abdominal swelling, no bladder incontinence, no bowel incontinence, no chest pain, no dysuria, no fever, no headaches, no numbness, no paresthesias, no pelvic pain, no perianal numbness, no tingling and no weakness     Past Medical History  Diagnosis Date  . Kidney stones   . Abnormal Pap smear   . Depression   . UTI (lower urinary tract infection)   . ADD (attention deficit disorder)    Past Surgical History  Procedure Laterality Date  . Colposcopy     History reviewed. No pertinent family history. History  Substance Use Topics  . Smoking status: Former Games developer  . Smokeless tobacco: Not on file  . Alcohol Use: No   OB History   Grav Para Term Preterm Abortions TAB SAB Ect Mult Living   2 1 1  1   1  1      Review of Systems  Constitutional: Negative for fever.  Respiratory: Negative for shortness of breath.   Cardiovascular: Negative for chest pain.  Gastrointestinal: Negative for vomiting, abdominal pain, constipation and bowel incontinence.  Genitourinary: Negative for bladder incontinence, dysuria, hematuria, flank pain, decreased  urine volume, difficulty urinating and pelvic pain.       No perineal numbness or incontinence of urine or feces  Musculoskeletal: Positive for back pain. Negative for joint swelling.  Skin: Negative for rash.  Neurological: Negative for tingling, weakness, numbness, headaches and paresthesias.  All other systems reviewed and are negative.    Allergies  Latex  Home Medications   Current Outpatient Rx  Name  Route  Sig  Dispense  Refill  . busPIRone (BUSPAR) 5 MG tablet   Oral   Take 5 mg by mouth daily as needed. For anxiety         . dexamethasone (DECADRON) 6 MG tablet      1 po daily with food   6 tablet   0   . HYDROcodone-acetaminophen (NORCO) 5-325 MG per tablet      1 at hs or q6h prn pain   15 tablet   0   . HYDROcodone-acetaminophen (NORCO/VICODIN) 5-325 MG per tablet   Oral   Take 1 tablet by mouth every 4 (four) hours as needed for pain.   15 tablet   0   . HYDROcodone-acetaminophen (NORCO/VICODIN) 5-325 MG per tablet      Take one-two tabs po q 4-6 hrs prn pain   15 tablet   0   . lisdexamfetamine (VYVANSE) 30 MG capsule   Oral   Take 30 mg by mouth every morning. ADD         . methocarbamol (ROBAXIN) 500 MG tablet   Oral  Take 1 tablet (500 mg total) by mouth 3 (three) times daily.   21 tablet   0   . methocarbamol (ROBAXIN) 500 MG tablet   Oral   Take 2 tablets (1,000 mg total) by mouth 3 (three) times daily.   42 tablet   0   . Norgestim-Eth Estrad Triphasic (ORTHO TRI-CYCLEN, 28, PO)   Oral   Take 1 tablet by mouth every evening.         . predniSONE (DELTASONE) 20 MG tablet      Take 3 tablets po qd x 2 days, then 2 tablets po qd x 2 days, then 1 tablet po qd x 2 days   12 tablet   0    BP 102/40  Pulse 80  Temp(Src) 98.4 F (36.9 C) (Oral)  Resp 14  Ht 5\' 6"  (1.676 m)  Wt 140 lb (63.504 kg)  BMI 22.61 kg/m2  SpO2 100%  LMP 01/29/2013 Physical Exam  Nursing note and vitals reviewed. Constitutional: She is  oriented to person, place, and time. She appears well-developed and well-nourished. No distress.  HENT:  Head: Normocephalic and atraumatic.  Neck: Normal range of motion. Neck supple.  Cardiovascular: Normal rate, regular rhythm, normal heart sounds and intact distal pulses.   No murmur heard. Pulmonary/Chest: Effort normal and breath sounds normal. No respiratory distress.  Abdominal: Soft. She exhibits no distension. There is no tenderness.  Musculoskeletal: She exhibits tenderness. She exhibits no edema.       Lumbar back: She exhibits tenderness and pain. She exhibits normal range of motion, no swelling, no deformity, no laceration and normal pulse.  ttp of the left lumbar paraspinal muscles and SI joint.  No spinal tenderness.  DP pulses are brisk and symmetrical.  Distal sensation intact.  Hip Flexors/Extensors are intact  Neurological: She is alert and oriented to person, place, and time. She has normal strength. No sensory deficit. She exhibits normal muscle tone. Coordination and gait normal.  Reflex Scores:      Patellar reflexes are 2+ on the right side and 2+ on the left side.      Achilles reflexes are 2+ on the right side and 2+ on the left side. Skin: Skin is warm and dry. No rash noted.    ED Course  Procedures (including critical care time) Labs Review Labs Reviewed - No data to display Imaging Review Dg Lumbar Spine Complete  02/13/2013   CLINICAL DATA:  Lower back pain for 1 day.  EXAM: LUMBAR SPINE - COMPLETE 4+ VIEW  COMPARISON:  Lumbar spine radiographs performed 12/13/2011  FINDINGS: There is no evidence of fracture or subluxation. Chronic bilateral pars defects at L5 are better characterized on the prior study, with only minimal anterolisthesis of L5 on S1. Vertebral bodies otherwise demonstrate normal height and alignment. Intervertebral disc spaces are preserved. The visualized neural foramina are grossly unremarkable in appearance.  The visualized bowel gas pattern  is unremarkable in appearance; air and stool are noted within the colon. The sacroiliac joints are within normal limits.  IMPRESSION: 1. No evidence of acute fracture or subluxation along the lumbar spine. 2. Chronic bilateral pars defects at L5 are better characterized on the prior study, with only minimal anterolisthesis of L5 on S1.   Electronically Signed   By: Roanna Raider M.D.   On: 02/13/2013 21:26    EKG Interpretation   None       MDM   1. Lumbar strain    Xrays obtained at  patient's request.  Results reviewed with the patient.    Patient has ttp of the left lumbar paraspinal muscles and SI joint.  No focal neuro deficits on exam.  Ambulates with a steady gait.   No concerning sx's for emergent neurological or infectious process. She agrees to symptomatic tx with hydrocodone #15, robaxin and prednisone taper.  Pt stable for d/c and agrees to plan.      Ayren Zumbro L. Trisha Mangleriplett, PA-C 02/15/13 1734

## 2013-02-16 NOTE — ED Provider Notes (Signed)
Medical screening examination/treatment/procedure(s) were performed by non-physician practitioner and as supervising physician I was immediately available for consultation/collaboration.  EKG Interpretation   None         Glynn OctaveStephen Mairyn Lenahan, MD 02/16/13 [phone]

## 2013-05-04 ENCOUNTER — Emergency Department (HOSPITAL_COMMUNITY): Payer: Medicaid Other

## 2013-05-04 ENCOUNTER — Emergency Department (HOSPITAL_COMMUNITY)
Admission: EM | Admit: 2013-05-04 | Discharge: 2013-05-04 | Disposition: A | Discharge status: Home / Self Care | Payer: Medicaid Other | Attending: Emergency Medicine | Admitting: Emergency Medicine

## 2013-05-04 ENCOUNTER — Encounter (HOSPITAL_COMMUNITY): Payer: Self-pay | Admitting: Emergency Medicine

## 2013-05-04 DIAGNOSIS — S335XXA Sprain of ligaments of lumbar spine, initial encounter: Secondary | ICD-10-CM | POA: Insufficient documentation

## 2013-05-04 DIAGNOSIS — Z8659 Personal history of other mental and behavioral disorders: Secondary | ICD-10-CM | POA: Insufficient documentation

## 2013-05-04 DIAGNOSIS — Z87440 Personal history of urinary (tract) infections: Secondary | ICD-10-CM | POA: Insufficient documentation

## 2013-05-04 DIAGNOSIS — S39012A Strain of muscle, fascia and tendon of lower back, initial encounter: Secondary | ICD-10-CM

## 2013-05-04 DIAGNOSIS — Z91040 Latex allergy status: Secondary | ICD-10-CM | POA: Insufficient documentation

## 2013-05-04 DIAGNOSIS — IMO0002 Reserved for concepts with insufficient information to code with codable children: Secondary | ICD-10-CM | POA: Insufficient documentation

## 2013-05-04 DIAGNOSIS — Z87891 Personal history of nicotine dependence: Secondary | ICD-10-CM | POA: Insufficient documentation

## 2013-05-04 DIAGNOSIS — Z87442 Personal history of urinary calculi: Secondary | ICD-10-CM | POA: Insufficient documentation

## 2013-05-04 DIAGNOSIS — M419 Scoliosis, unspecified: Secondary | ICD-10-CM

## 2013-05-04 DIAGNOSIS — Y9389 Activity, other specified: Secondary | ICD-10-CM | POA: Insufficient documentation

## 2013-05-04 DIAGNOSIS — Y929 Unspecified place or not applicable: Secondary | ICD-10-CM | POA: Insufficient documentation

## 2013-05-04 DIAGNOSIS — Y990 Civilian activity done for income or pay: Secondary | ICD-10-CM | POA: Insufficient documentation

## 2013-05-04 DIAGNOSIS — M4120 Other idiopathic scoliosis, site unspecified: Secondary | ICD-10-CM | POA: Insufficient documentation

## 2013-05-04 DIAGNOSIS — X500XXA Overexertion from strenuous movement or load, initial encounter: Secondary | ICD-10-CM | POA: Insufficient documentation

## 2013-05-04 MED ORDER — CYCLOBENZAPRINE HCL 10 MG PO TABS: 10.0000 mg | ORAL_TABLET | Freq: Three times a day (TID) | ORAL | Status: AC | PRN

## 2013-05-04 MED ORDER — PREDNISONE 10 MG PO TABS: ORAL_TABLET | ORAL | Status: DC

## 2013-05-04 MED ORDER — HYDROCODONE-ACETAMINOPHEN 5-325 MG PO TABS: ORAL_TABLET | ORAL | Status: AC

## 2013-05-04 NOTE — ED Provider Notes (Signed)
CSN: [id number]     Arrival date & time 05/04/13  1341 History   First MD Initiated Contact with Patient 05/04/13 1609     Chief Complaint  Patient presents with  . Hip Pain     (Consider location/radiation/quality/duration/timing/severity/associated sxs/prior Treatment) Patient is a 33 y.o. female presenting with back pain. The history is provided by the patient.  Back Pain Location:  Lumbar spine Quality:  Aching and shooting Radiates to: radiates across her lower back and into the right buttocks and righ thtigh. Pain severity:  Moderate Pain is:  Same all the time Onset quality:  Sudden Duration:  1 day Timing:  Constant Progression:  Worsening Chronicity:  Recurrent Context: twisting   Relieved by:  Being still and bed rest Worsened by:  Bending, twisting, standing and movement Ineffective treatments:  None tried Associated symptoms: leg pain   Associated symptoms: no abdominal pain, no abdominal swelling, no bladder incontinence, no bowel incontinence, no chest pain, no dysuria, no fever, no headaches, no numbness, no paresthesias, no pelvic pain, no perianal numbness, no tingling and no weakness     Past Medical History  Diagnosis Date  . Kidney stones   . Abnormal Pap smear   . Depression   . UTI (lower urinary tract infection)   . ADD (attention deficit disorder)    Past Surgical History  Procedure Laterality Date  . Colposcopy     No family history on file. History  Substance Use Topics  . Smoking status: Former Games developer  . Smokeless tobacco: Not on file  . Alcohol Use: No   OB History   Grav Para Term Preterm Abortions TAB SAB Ect Mult Living   2 1 1  1   1  1      Review of Systems  Constitutional: Negative for fever.  Respiratory: Negative for shortness of breath.   Cardiovascular: Negative for chest pain.  Gastrointestinal: Negative for vomiting, abdominal pain, constipation and bowel incontinence.  Genitourinary: Negative for bladder incontinence,  dysuria, hematuria, flank pain, decreased urine volume, difficulty urinating and pelvic pain.       No perineal numbness or incontinence of urine or feces  Musculoskeletal: Positive for back pain. Negative for joint swelling.  Skin: Negative for rash.  Neurological: Negative for tingling, weakness, numbness, headaches and paresthesias.  All other systems reviewed and are negative.     Allergies  Latex  Home Medications  No current outpatient prescriptions on file. BP 106/55  Pulse 70  Temp(Src) 97.9 F (36.6 C) (Oral)  Resp 19  SpO2 100%  LMP 05/01/2013 Physical Exam  Nursing note and vitals reviewed. Constitutional: She is oriented to person, place, and time. She appears well-developed and well-nourished. No distress.  HENT:  Head: Normocephalic and atraumatic.  Neck: Normal range of motion. Neck supple.  Cardiovascular: Normal rate, regular rhythm, normal heart sounds and intact distal pulses.   No murmur heard. Pulmonary/Chest: Effort normal and breath sounds normal. No respiratory distress.  Abdominal: Soft. She exhibits no distension. There is no tenderness.  Musculoskeletal: She exhibits tenderness. She exhibits no edema.       Lumbar back: She exhibits tenderness and pain. She exhibits normal range of motion, no swelling, no deformity, no laceration and normal pulse.       Back:  ttp of the lumbar paraspinal muscles and right SI joint.  No spinal tenderness.  DP pulses are brisk and symmetrical.  Distal sensation intact.  Hip Flexors/Extensors are intact.  Pain reproduced with SLR on  right  Neurological: She is alert and oriented to person, place, and time. She has normal strength. No sensory deficit. She exhibits normal muscle tone. Coordination and gait normal.  Reflex Scores:      Patellar reflexes are 2+ on the right side and 2+ on the left side.      Achilles reflexes are 2+ on the right side and 2+ on the left side. Skin: Skin is warm and dry. No rash noted.     ED Course  Procedures (including critical care time) Labs Review Labs Reviewed - No data to display Imaging Review Dg Lumbar Spine Complete  05/04/2013   CLINICAL DATA:  Low back pain.  EXAM: LUMBAR SPINE - COMPLETE 4+ VIEW  COMPARISON:  February 13, 2013.  FINDINGS: Bilateral pars defects are noted. Mild levoscoliosis of lower lumbar spine is noted. No significant spondylolisthesis is noted. Disc spaces are well-maintained.  IMPRESSION: Bilateral pars defects of L5. No acute abnormality seen in the lumbar spine.   Electronically Signed   By: Roque LiasJames  Green M.D.   On: 05/04/2013 14:51     EKG Interpretation None      MDM   Final diagnoses:  Lumbar strain  Scoliosis of lumbar spine    Patient has ttp of the right  lumbar paraspinal muscles.  No focal neuro deficits on exam.  Ambulates with a steady gait.   Pain described at triage is likely related to radicular pain on right.  Pt has full ROM of the bilateral hips and able to internal and externally rotate bilateral hips w/o pain.  No concerning sx's for septic joint.  Pt agrees to symptomatic treatment with prednisone taper, flexeril and vicodin #20 for pain.  Pt states that she has seen Dr. Hilda LiasKeeling for this previously and prefers to f/u with him again if needed.    Pt is ambulatory, no focal neuro deficits and no concerning sx's for emergent neurological or infectious process.     Khaleel Beckom L. Trisha Mangleriplett, PA-C 05/06/13 1857

## 2013-05-04 NOTE — Discharge Instructions (Signed)
Back Pain, Adult °Back pain is very common. The pain often gets better over time. The cause of back pain is usually not dangerous. Most people can learn to manage their back pain on their own.  °HOME CARE  °· Stay active. Start with short walks on flat ground if you can. Try to walk farther each day. °· Do not sit, drive, or stand in one place for more than 30 minutes. Do not stay in bed. °· Do not avoid exercise or work. Activity can help your back heal faster. °· Be careful when you bend or lift an object. Bend at your knees, keep the object close to you, and do not twist. °· Sleep on a firm mattress. Lie on your side, and bend your knees. If you lie on your back, put a pillow under your knees. °· Only take medicines as told by your doctor. °· Put ice on the injured area. °· Put ice in a plastic bag. °· Place a towel between your skin and the bag. °· Leave the ice on for 15-20 minutes, 03-04 times a day for the first 2 to 3 days. After that, you can switch between ice and heat packs. °· Ask your doctor about back exercises or massage. °· Avoid feeling anxious or stressed. Find good ways to deal with stress, such as exercise. °GET HELP RIGHT AWAY IF:  °· Your pain does not go away with rest or medicine. °· Your pain does not go away in 1 week. °· You have new problems. °· You do not feel well. °· The pain spreads into your legs. °· You cannot control when you poop (bowel movement) or pee (urinate). °· Your arms or legs feel weak or lose feeling (numbness). °· You feel sick to your stomach (nauseous) or throw up (vomit). °· You have belly (abdominal) pain. °· You feel like you may pass out (faint). °MAKE SURE YOU:  °· Understand these instructions. °· Will watch your condition. °· Will get help right away if you are not doing well or get worse. °Document Released: 07/04/2007 Document Revised: 04/09/2011 Document Reviewed: 06/05/2010 °[employer]® Patient Information ©2014 [employer]. ° °Lumbosacral  Strain °Lumbosacral strain is a strain of any of the parts that make up your lumbosacral vertebrae. Your lumbosacral vertebrae are the bones that make up the lower third of your backbone. Your lumbosacral vertebrae are held together by muscles and tough, fibrous tissue (ligaments).  °CAUSES  °A sudden blow to your back can cause lumbosacral strain. Also, anything that causes an excessive stretch of the muscles in the low back can cause this strain. This is typically seen when people exert themselves strenuously, fall, lift heavy objects, bend, or crouch repeatedly. °RISK FACTORS °· Physically demanding work. °· Participation in pushing or pulling sports or sports that require sudden twist of the back (tennis, golf, baseball). °· Weight lifting. °· Excessive lower back curvature. °· Forward-tilted pelvis. °· Weak back or abdominal muscles or both. °· Tight hamstrings. °SIGNS AND SYMPTOMS  °Lumbosacral strain may cause pain in the area of your injury or pain that moves (radiates) down your leg.  °DIAGNOSIS °Your health care provider can often diagnose lumbosacral strain through a physical exam. In some cases, you may need tests such as X-ray exams.  °TREATMENT  °Treatment for your lower back injury depends on many factors that your clinician will have to evaluate. However, most treatment will include the use of anti-inflammatory medicines. °HOME CARE INSTRUCTIONS  °· Avoid hard physical activities (tennis,   racquetball, waterskiing) if you are not in proper physical condition for it. This may aggravate or create problems.  If you have a back problem, avoid sports requiring sudden body movements. Swimming and walking are generally safer activities.  Maintain good posture.  Maintain a healthy weight.  For acute conditions, you may put ice on the injured area.  Put ice in a plastic bag.  Place a towel between your skin and the bag.  Leave the ice on for 20 minutes, 2 3 times a day.  When the low back  starts healing, stretching and strengthening exercises may be recommended. SEEK MEDICAL CARE IF:  Your back pain is getting worse.  You experience severe back pain not relieved with medicines. SEEK IMMEDIATE MEDICAL CARE IF:   You have numbness, tingling, weakness, or problems with the use of your arms or legs.  There is a change in bowel or bladder control.  You have increasing pain in any area of the body, including your belly (abdomen).  You notice shortness of breath, dizziness, or feel faint.  You feel sick to your stomach (nauseous), are throwing up (vomiting), or become sweaty.  You notice discoloration of your toes or legs, or your feet get very cold. MAKE SURE YOU:   Understand these instructions.  Will watch your condition.  Will get help right away if you are not doing well or get worse. Document Released: 10/25/2004 Document Revised: 11/05/2012 Document Reviewed: 09/03/2012 [facility] Patient Information 2014 [place], [place].  Scoliosis Scoliosis is the name given to a spine that curves sideways.Scoliosis can cause twisting of your shoulders, hips, chest, back, and rib cage.  CAUSES  The cause of scoliosis is not always known. It may be caused by a birth defect or by a disease that can cause muscular dysfunction and imbalance, such as cerebral palsy and muscular dystrophy.  RISK FACTORS Having a disease that causes muscle disease or dysfunction. SIGNS AND SYMPTOMS Scoliosis often has no signs or symptoms.If they are present, they may include:  Unequal size of one body side compared to the other (asymmetry).  Visible curvature of the spine.  Pain. The pain may limit physical activity.  Shortness of breath.  Bowel or bladder issues. DIAGNOSIS A skilled health care provider will perform an evaluation. This will involve:  Taking your history.  Performing a physical examination.  Performing neurological exam to detect nerve or muscle function  loss.  Range of motion studies on the spine.  X-rays. An MRI may also be obtained. TREATMENT  Treatment varies depending on the nature, extent, and severity of the disease. If the curvature is not great, you may need only observation. A brace may be used to prevent scoliosis from progressing. A brace may also be needed during growth spurts. Physical therapy may be of benefit. Surgery may be required.  HOME CARE INSTRUCTIONS   Your health care provider may suggest exercises to strengthen your muscles. Perform them as directed.  Ask your health care provider before participating in any sports.   If you have been prescribed an orthopedic brace, wear it as instructed by your health care provider. SEEK MEDICAL CARE IF: Your brace causes the skin to become sore (chafe) or is uncomfortable.  SEEK IMMEDIATE MEDICAL CARE IF:  You have back pain that is not relieved by the medicines prescribed by your health care provider.   Your legs feel weak or you lose function in your legs.  You lose some bowel or bladder control.  Document Released: 01/13/2000  Document Revised: 11/05/2012 Document Reviewed: 09/22/2012 [facility] Patient Information 2014 [place].

## 2013-05-04 NOTE — ED Notes (Signed)
Pt arrives with c/o bilateral hip pain, states she threw her back out at work. 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 States that pain increased with movement, states she feels like she pinched a nerve.

## 2013-05-08 NOTE — ED Provider Notes (Signed)
Medical screening examination/treatment/procedure(s) were performed by non-physician practitioner and as supervising physician I was immediately available for consultation/collaboration.   EKG Interpretation None       Zarahi Fuerst, MD 05/08/13 1949 

## 2013-11-16 ENCOUNTER — Other Ambulatory Visit: Payer: Self-pay | Admitting: Adult Health

## 2013-11-16 ENCOUNTER — Encounter: Payer: Self-pay | Admitting: Adult Health

## 2013-11-30 ENCOUNTER — Encounter (HOSPITAL_COMMUNITY): Payer: Self-pay | Admitting: Emergency Medicine

## 2015-02-14 IMAGING — CR DG LUMBAR SPINE COMPLETE 4+V
5 series · 5 of 5 positions shown · non-contrast
Comparison: Lumbar spine radiographs performed 12/13/2011

CLINICAL DATA: Lower back pain for 1 day.

EXAM:
LUMBAR SPINE - COMPLETE 4+ VIEW

[view not recorded (1 of 5)]
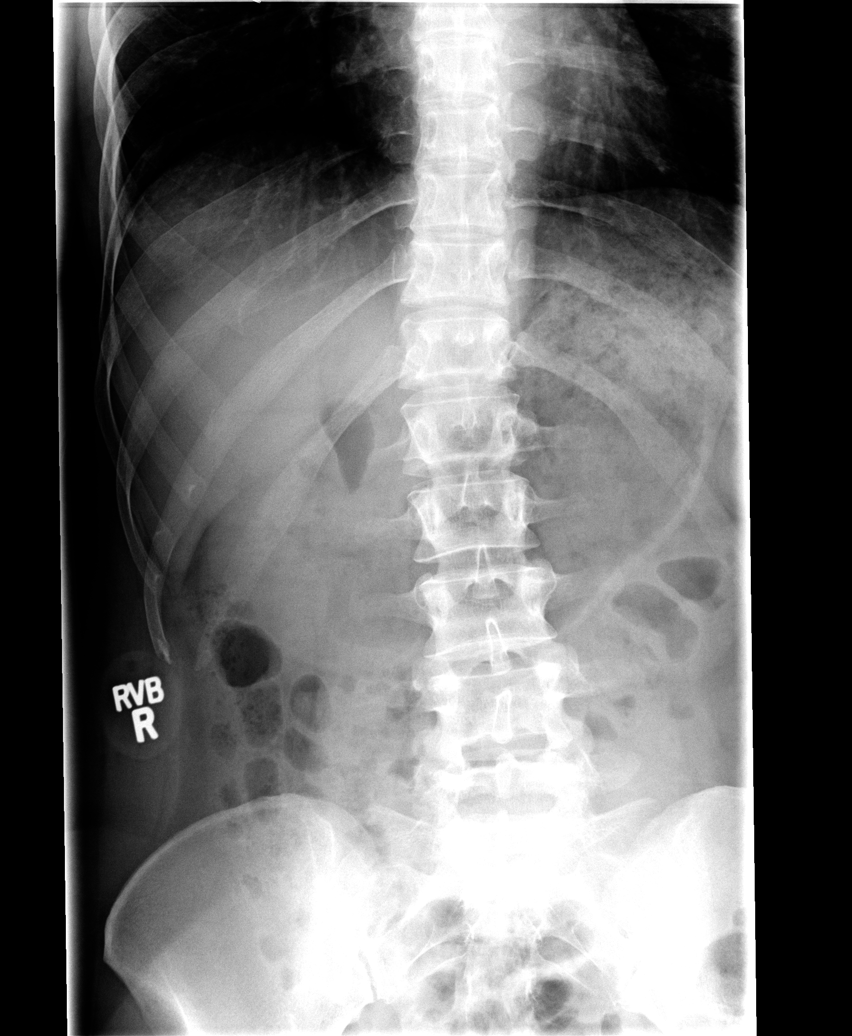

[view not recorded (2 of 5)]
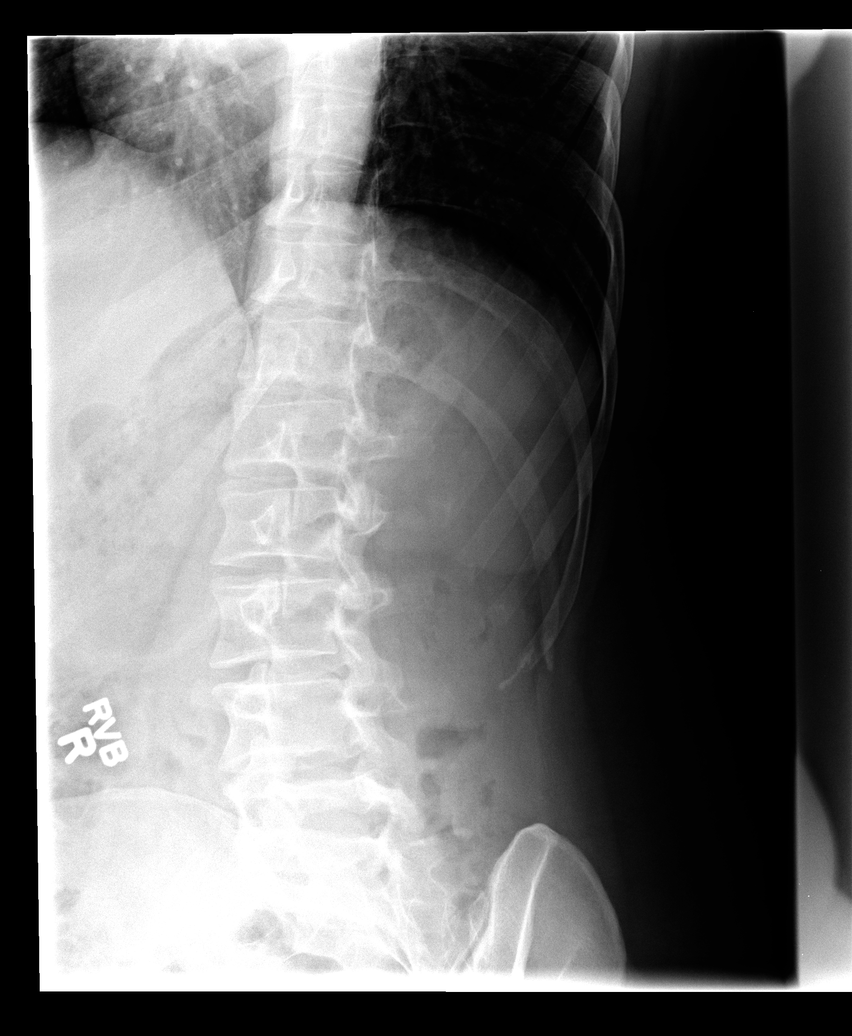

[view not recorded (3 of 5)]
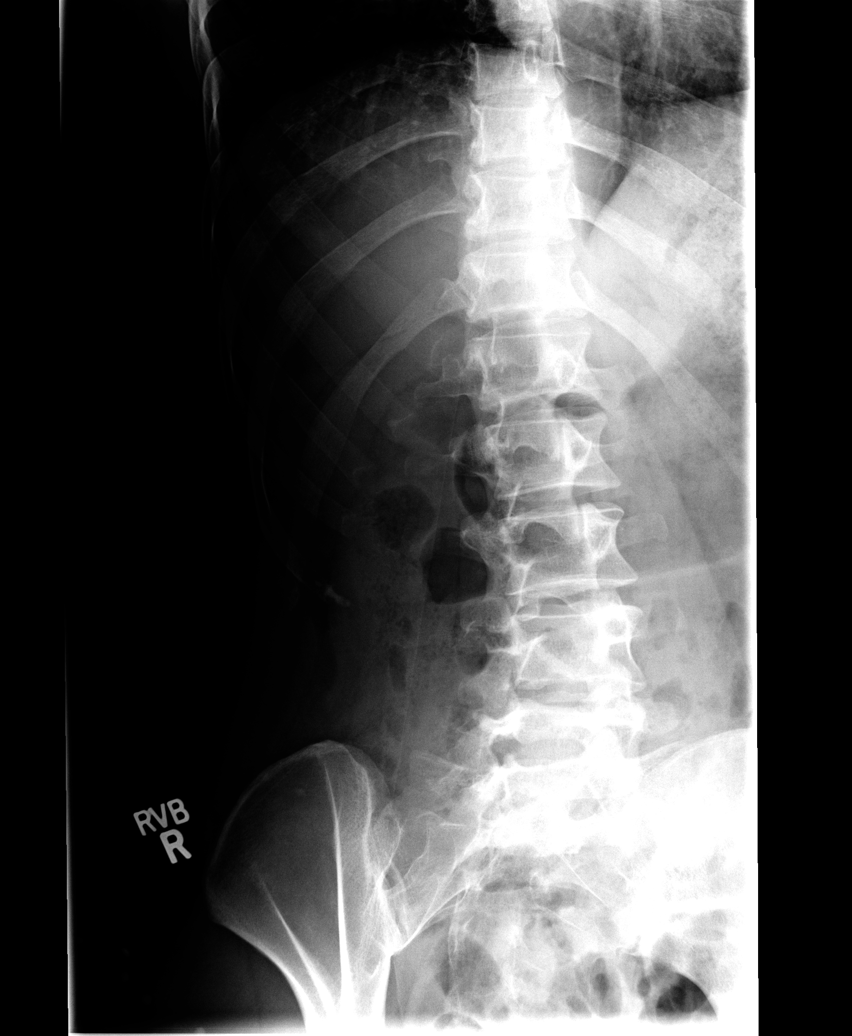

[view not recorded (4 of 5)]
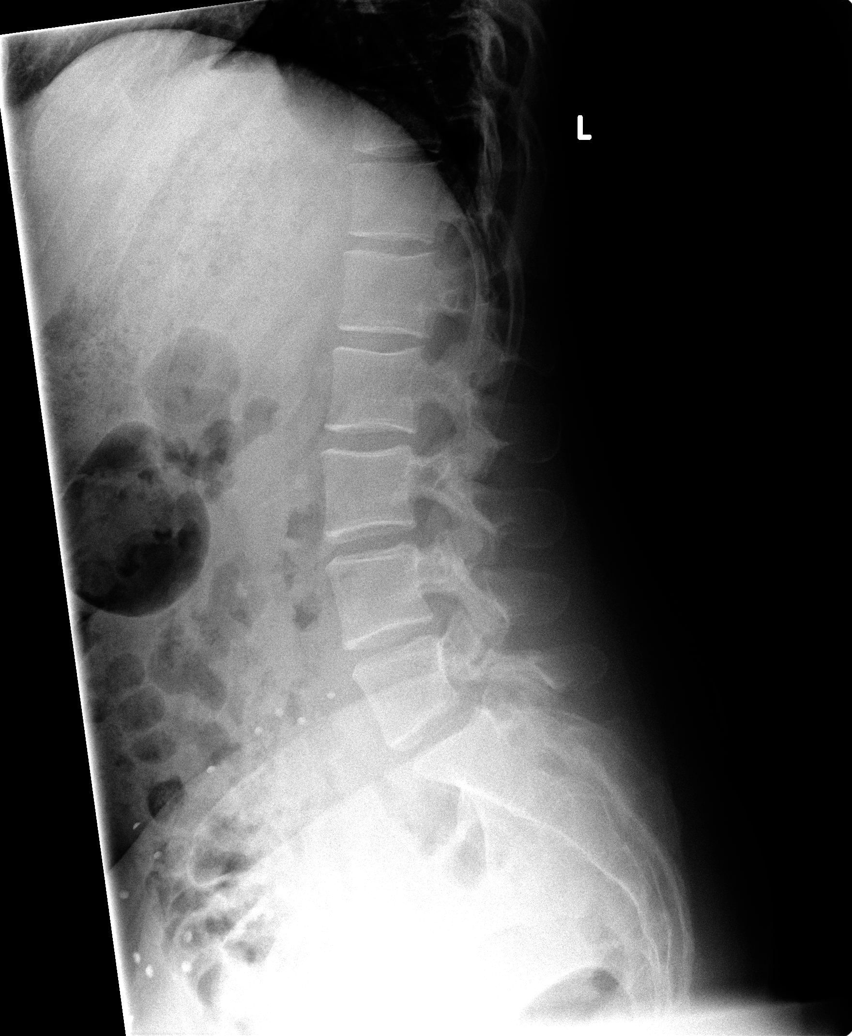

[view not recorded (5 of 5)]
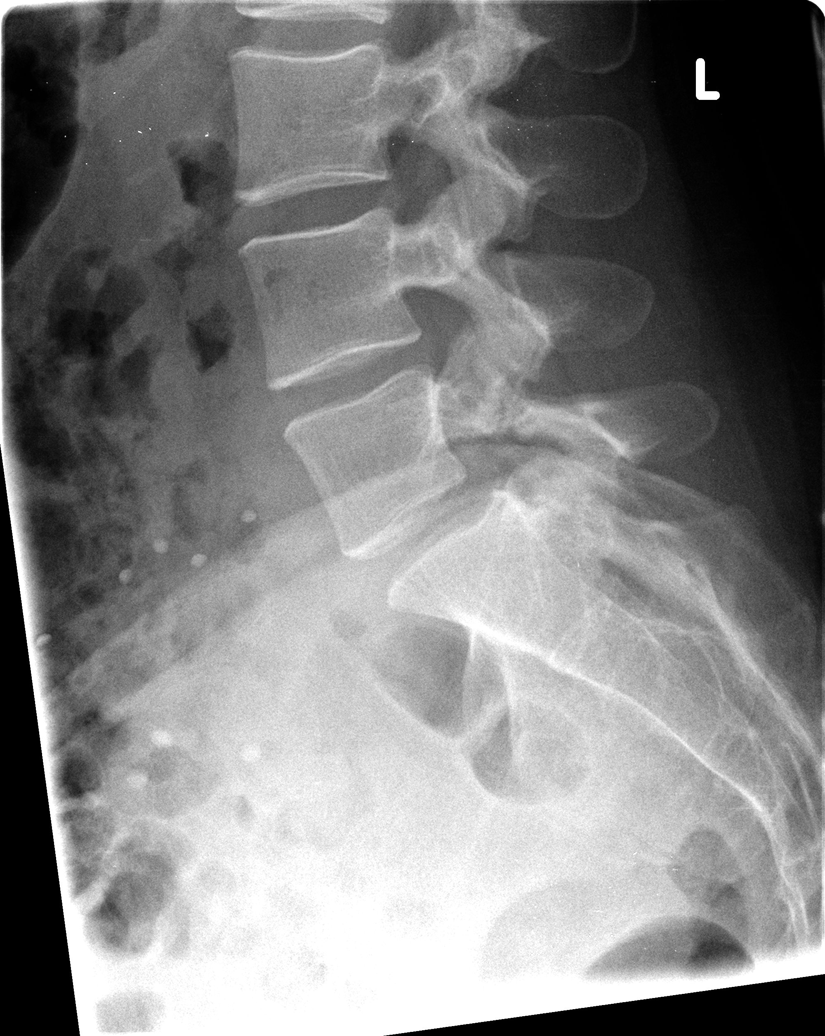

[5 of 5 positions shown; findings below may reference images not displayed]

FINDINGS: There is no evidence of fracture or subluxation. Chronic bilateral
pars defects at L5 are better characterized on the prior study, with
only minimal anterolisthesis of L5 on S1. Vertebral bodies otherwise
demonstrate normal height and alignment. Intervertebral disc spaces
are preserved. The visualized neural foramina are grossly
unremarkable in appearance.

The visualized bowel gas pattern is unremarkable in appearance; air
and stool are noted within the colon. The sacroiliac joints are
within normal limits.
IMPRESSION: 1. No evidence of acute fracture or subluxation along the lumbar
spine.
2. Chronic bilateral pars defects at L5 are better characterized on
the prior study, with only minimal anterolisthesis of L5 on S1.

## 2015-10-25 ENCOUNTER — Other Ambulatory Visit (HOSPITAL_COMMUNITY)
Admission: RE | Admit: 2015-10-25 | Discharge: 2015-10-25 | Disposition: A | Discharge status: Home / Self Care | Payer: BLUE CROSS/BLUE SHIELD | Source: Ambulatory Visit | Attending: Adult Health | Admitting: Adult Health

## 2015-10-25 ENCOUNTER — Ambulatory Visit (INDEPENDENT_AMBULATORY_CARE_PROVIDER_SITE_OTHER): Payer: BLUE CROSS/BLUE SHIELD | Admitting: Adult Health

## 2015-10-25 ENCOUNTER — Encounter: Payer: Self-pay | Admitting: Adult Health

## 2015-10-25 VITALS — BP 90/58 | HR 70 | Ht 66.0 in | Wt 137.0 lb

## 2015-10-25 DIAGNOSIS — Z3009 Encounter for other general counseling and advice on contraception: Secondary | ICD-10-CM

## 2015-10-25 DIAGNOSIS — Z01419 Encounter for gynecological examination (general) (routine) without abnormal findings: Secondary | ICD-10-CM | POA: Insufficient documentation

## 2015-10-25 DIAGNOSIS — Z1151 Encounter for screening for human papillomavirus (HPV): Secondary | ICD-10-CM | POA: Insufficient documentation

## 2015-10-25 DIAGNOSIS — Z113 Encounter for screening for infections with a predominantly sexual mode of transmission: Secondary | ICD-10-CM | POA: Insufficient documentation

## 2015-10-25 DIAGNOSIS — N83202 Unspecified ovarian cyst, left side: Secondary | ICD-10-CM | POA: Insufficient documentation

## 2015-10-25 NOTE — Patient Instructions (Signed)
Ovarian Cyst An ovarian cyst is a fluid-filled sac that forms on an ovary. The ovaries are small organs that produce eggs in women. Various types of cysts can form on the ovaries. Most are not cancerous. Many do not cause problems, and they often go away on their own. Some may cause symptoms and require treatment. Common types of ovarian cysts include:  Functional cysts--These cysts may occur every month during the menstrual cycle. This is normal. The cysts usually go away with the next menstrual cycle if the woman does not get pregnant. Usually, there are no symptoms with a functional cyst.  Endometrioma cysts--These cysts form from the tissue that lines the uterus. They are also called "chocolate cysts" because they become filled with blood that turns brown. This type of cyst can cause pain in the lower abdomen during intercourse and with your menstrual period.  Cystadenoma cysts--This type develops from the cells on the outside of the ovary. These cysts can get very big and cause lower abdomen pain and pain with intercourse. This type of cyst can twist on itself, cut off its blood supply, and cause severe pain. It can also easily rupture and cause a lot of pain.  Dermoid cysts--This type of cyst is sometimes found in both ovaries. These cysts may contain different kinds of body tissue, such as skin, teeth, hair, or cartilage. They usually do not cause symptoms unless they get very big.  Theca lutein cysts--These cysts occur when too much of a certain hormone (human chorionic gonadotropin) is produced and overstimulates the ovaries to produce an egg. This is most common after procedures used to assist with the conception of a baby (in vitro fertilization). CAUSES   Fertility drugs can cause a condition in which multiple large cysts are formed on the ovaries. This is called ovarian hyperstimulation syndrome.  A condition called polycystic ovary syndrome can cause hormonal imbalances that can lead to  nonfunctional ovarian cysts. SIGNS AND SYMPTOMS  Many ovarian cysts do not cause symptoms. If symptoms are present, they may include:  Pelvic pain or pressure.  Pain in the lower abdomen.  Pain during sexual intercourse.  Increasing girth (swelling) of the abdomen.  Abnormal menstrual periods.  Increasing pain with menstrual periods.  Stopping having menstrual periods without being pregnant. DIAGNOSIS  These cysts are commonly found during a routine or annual pelvic exam. Tests may be ordered to find out more about the cyst. These tests may include:  Ultrasound.  X-ray of the pelvis.  CT scan.  MRI.  Blood tests. TREATMENT  Many ovarian cysts go away on their own without treatment. Your health care provider may want to check your cyst regularly for 2-3 months to see if it changes. For women in menopause, it is particularly important to monitor a cyst closely because of the higher rate of ovarian cancer in menopausal women. When treatment is needed, it may include any of the following:  A procedure to drain the cyst (aspiration). This may be done using a long needle and ultrasound. It can also be done through a laparoscopic procedure. This involves using a thin, lighted tube with a tiny camera on the end (laparoscope) inserted through a small incision.  Surgery to remove the whole cyst. This may be done using laparoscopic surgery or an open surgery involving a larger incision in the lower abdomen.  Hormone treatment or birth control pills. These methods are sometimes used to help dissolve a cyst. HOME CARE INSTRUCTIONS   Only take over-the-counter   or prescription medicines as directed by your health care provider.  Follow up with your health care provider as directed.  Get regular pelvic exams and Pap tests. SEEK MEDICAL CARE IF:   Your periods are late, irregular, or painful, or they stop.  Your pelvic pain or abdominal pain does not go away.  Your abdomen becomes  larger or swollen.  You have pressure on your bladder or trouble emptying your bladder completely.  You have pain during sexual intercourse.  You have feelings of fullness, pressure, or discomfort in your stomach.  You lose weight for no apparent reason.  You feel generally ill.  You become constipated.  You lose your appetite.  You develop acne.  You have an increase in body and facial hair.  You are gaining weight, without changing your exercise and eating habits.  You think you are pregnant. SEEK IMMEDIATE MEDICAL CARE IF:   You have increasing abdominal pain.  You feel sick to your stomach (nauseous), and you throw up (vomit).  You develop a fever that comes on suddenly.  You have abdominal pain during a bowel movement.  Your menstrual periods become heavier than usual. MAKE SURE YOU:  Understand these instructions.  Will watch your condition.  Will get help right away if you are not doing well or get worse.   This information is not intended to replace advice given to you by your health care provider. Make sure you discuss any questions you have with your health care provider.   Document Released: 01/15/2005 Document Revised: 01/20/2013 Document Reviewed: 09/22/2012 Elsevier Interactive Patient Education [employer]2016 Elsevier Inc. Return in 10 days for [place]

## 2015-10-25 NOTE — Progress Notes (Signed)
Patient ID: Laura Kemp, female   DOB: October 27, 1980, 35 y.o.   MRN: [id number] History of Present Illness: Laura Kemp is a 35 year old white female, new to this practice in for well woman gyn exam and was seen in ER in [place] for kidney stone Sunday night and had CT and was told has 3.5 cm cyst left ovary and needed to see OB/GYN.   Current Medications, Allergies, Past Medical History, Past Surgical History, Family History and Social History were reviewed in [facility] electronic medical record.     Review of Systems: Patient denies any  hearing loss, fatigue, blurred vision, shortness of breath, chest pain, abdominal pain, problems with bowel movements, urination(pees a lot and sees urologist), or intercourse(not having sex). No joint pain or mood swings. Has headaches regularly, see HPI for positives.   Physical Exam:BP (!) 90/58 (BP Location: Left Arm, Patient Position: Sitting, Cuff Size: Normal)   Pulse 70   Ht 5\' 6"  (1.676 m)   Wt 137 lb (62.1 kg)   LMP 10/04/2015   BMI 22.11 kg/m  General:  Well developed, well nourished, no acute distress Skin:  Warm and dry Neck:  Midline trachea, normal thyroid, good ROM, no lymphadenopathy Lungs; Clear to auscultation bilaterally Breast:  No dominant palpable mass, retraction, or nipple discharge Cardiovascular: Regular rate and rhythm Abdomen:  Soft, non tender, no hepatosplenomegaly Pelvic:  External genitalia is normal in appearance, no lesions.  The vagina is normal in appearance. Urethra has no lesions or masses. The cervix is bulbous, pap with HPV and GC/CHL performed.  Uterus is felt to be normal size, shape, and contour.  No adnexal masses, LLQ tenderness noted.Bladder is non tender, no masses felt. Extremities/musculoskeletal:  No swelling or varicosities noted, no clubbing or cyanosis Psych:  No mood changes, alert and cooperative,seems happy PHQ 2 score 0.She is not really interested in birth control, will get GYN [place] to assess  ovary after period.  Impression: 1. Encounter for gynecological examination with Papanicolaou smear of cervix   2. Family planning   3. Cyst of left ovary       Plan: Check HIV,RPR Return 10/9 for GYN US Physical in 1 year, pap in 3 if normal Review handout on ovarian cyst

## 2015-10-26 LAB — RPR: RPR: NONREACTIVE

## 2015-10-26 LAB — CYTOLOGY - PAP

## 2015-10-26 LAB — HIV ANTIBODY (ROUTINE TESTING W REFLEX): HIV Screen 4th Generation wRfx: NONREACTIVE

## 2015-11-08 ENCOUNTER — Ambulatory Visit (INDEPENDENT_AMBULATORY_CARE_PROVIDER_SITE_OTHER): Payer: BLUE CROSS/BLUE SHIELD

## 2015-11-08 DIAGNOSIS — N83202 Unspecified ovarian cyst, left side: Secondary | ICD-10-CM

## 2015-11-08 DIAGNOSIS — N854 Malposition of uterus: Secondary | ICD-10-CM

## 2015-11-08 NOTE — Progress Notes (Signed)
PELVIC US TA/TV: Homogeneous anteverted uterus, wnl,EEC 6.8 cm,hemorrhagic cyst left ovary 2.6 x 2.1 x 1.2 cm,normal right ovary,no free fluid,ov's appear mobile,no pain during ultrasound

## 2015-11-10 ENCOUNTER — Telehealth: Payer: Self-pay | Admitting: Adult Health

## 2015-11-10 NOTE — Telephone Encounter (Signed)
Left message to have [place]

## 2015-11-10 NOTE — Telephone Encounter (Signed)
Pt aware US showed hemorrhagic left ovarian cyst, otherwise normal

## 2016-10-30 ENCOUNTER — Encounter: Payer: Self-pay | Admitting: General Surgery

## 2016-10-30 ENCOUNTER — Ambulatory Visit (INDEPENDENT_AMBULATORY_CARE_PROVIDER_SITE_OTHER): Payer: BLUE CROSS/BLUE SHIELD | Admitting: General Surgery

## 2016-10-30 VITALS — BP 102/40 | HR 72 | Temp 98.7°F | Resp 18 | Ht 66.0 in | Wt 128.0 lb

## 2016-10-30 DIAGNOSIS — K5901 Slow transit constipation: Secondary | ICD-10-CM

## 2016-10-30 DIAGNOSIS — K921 Melena: Secondary | ICD-10-CM

## 2016-10-30 NOTE — Patient Instructions (Signed)
Follow up December/ January for scheduling.

## 2016-10-30 NOTE — Progress Notes (Signed)
[facility] History and Physical  Reason for Referral: Mucus in stool and bright red blood in stool  Referring Physician:  Dr. Phillips Odor, MD   Chief Complaint    GI Bleeding      Laura Kemp is a 36 y.o. female.  HPI:  Laura Kemp is a very pleasant 36 yo with ADHD followed by Dr. Phillips Odor, who reported mucus in her stools. She reports that she has had chronic constipation since she can remember, and does not recall when the mucus started. She also reports blood in her stool on occassion. She is adopted and has no family history available. She denies any abdominal pain or weight loss, but does have bloating.   Past Medical History:  Diagnosis Date  . Abnormal Pap smear   . ADD (attention deficit disorder)   . Depression   . Kidney stones   . UTI (lower urinary tract infection)   . Vaginal Pap smear, abnormal     Past Surgical History:  Procedure Laterality Date  . COLPOSCOPY      Family History  Problem Relation Age of Onset  . Adopted: Yes    Social History  Substance Use Topics  . Smoking status: Former Games developer  . Smokeless tobacco: Never Used  . Alcohol use No    Medications: I have reviewed the patient's current medications. Allergies as of 10/30/2016      Reactions   Latex Itching      Medication List       Accurate as of 10/30/16 11:26 AM. Always use your most recent med list.          amphetamine-dextroamphetamine 30 MG tablet Commonly known as:  ADDERALL Take 30 mg by mouth daily.   cyclobenzaprine 10 MG tablet Commonly known as:  FLEXERIL Take 1 tablet (10 mg total) by mouth 3 (three) times daily as needed.   HYDROcodone-acetaminophen 5-325 MG tablet Commonly known as:  NORCO/VICODIN Take one-two tabs po q 4-6 hrs prn pain   loratadine 10 MG tablet Commonly known as:  CLARITIN Take 10 mg by mouth daily.   predniSONE 10 MG tablet Commonly known as:  DELTASONE Take 6 tablets day one, 5 tablets day two, 4 tablets day three, 3  tablets day four, 2 tablets day five, then 1 tablet day six   trimethoprim 100 MG tablet Commonly known as:  TRIMPEX Take 100 mg by mouth daily.        ROS:  Gastrointestinal: positive for constipation and blood and mucus in stool Neurological: positive for ADHD  Blood pressure (!) 102/40, pulse 72, temperature 98.7 F (37.1 C), resp. rate 18, height  (1.676 m), weight 128 lb (58.1 kg). Physical Exam  Constitutional: She is oriented to person, place, and time and well-developed, well-nourished, and in no distress.  HENT:  Head: Normocephalic.  Eyes: Pupils are equal, round, and reactive to light.  Cardiovascular: Normal rate and regular rhythm.   Pulmonary/Chest: Effort normal and breath sounds normal.  Abdominal: Soft. Bowel sounds are normal. She exhibits no distension. There is no tenderness.  Genitourinary: Rectum normal. Rectal exam shows no external hemorrhoid, no internal hemorrhoid, no fissure and no tenderness.  Musculoskeletal: Normal range of motion.  Neurological: She is alert and oriented to person, place, and time.  Skin: Skin is warm and dry.  Psychiatric: Mood, memory, affect and judgment normal.    Results: None  Assessment & Plan:  Laura Kemp is a 36 y.o. female with reported blood and mucus in  her stools. She reports a history of constipation since she can remember, and only having BMs on the days she takes her Aderral.  She denies any abdominal pain or weight loss or other Crohn's symptoms.  She denies any perirectal issues/ abscesses.  Given the lack of family history and bleeding/ mucus, I have discussed that it is reasonable to perform a colonoscopy with possible biopsy to assess if she has any signs of colitis or other lesions.  Discussed that the mucus could be a result of irritable bowel or inflammatory bowel, and discussed that ultimately she may need evaluation and treatment with a GI specialist that treat these disorders medically if we  determine she has this issue.     -Patient wants to proceed with colonoscopy but is unable to do before January, I discussed and I think this is a reasonable timeframe. -Follow up December for repeat H&P and scheduling   All questions were answered to the satisfaction of the patient.  The risk and benefits of the colonoscopy were discussed including but not limited to bleeding, perforation, need for additional procedures.  After careful consideration, Laura Kemp has decided to proceed in January.  She will call back.     Lucretia Roers 10/30/2016, 11:26 AM

## 2016-12-11 ENCOUNTER — Encounter: Payer: Self-pay | Admitting: Vascular Surgery

## 2017-02-05 ENCOUNTER — Encounter: Payer: Self-pay | Admitting: Vascular Surgery

## 2017-02-05 ENCOUNTER — Ambulatory Visit: Payer: BLUE CROSS/BLUE SHIELD | Admitting: Vascular Surgery

## 2017-02-05 VITALS — BP 111/70 | HR 82 | Temp 98.4°F | Resp 14 | Ht 66.0 in | Wt 124.0 lb

## 2017-02-05 DIAGNOSIS — I83893 Varicose veins of bilateral lower extremities with other complications: Secondary | ICD-10-CM | POA: Diagnosis not present

## 2017-02-05 DIAGNOSIS — I83891 Varicose veins of right lower extremities with other complications: Secondary | ICD-10-CM | POA: Insufficient documentation

## 2017-02-05 NOTE — Progress Notes (Signed)
Subjective:     Patient ID: Laura Kemp, female   DOB: [birth year], 37 y.o.   MRN: [id number]  HPI This 37 year old female is referred by Dr. Gust Rung evaluation of painful varicosities in the right leg. 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 She has a history of DVT thrombophlebitis stasis ulcers or bleeding. She has developed swelling in the right ankle as the day progresses. She develops an aching throbbing discomfort in the thigh and calf which worsens during the day. She has no symptoms the contralateral left leg. He does not elastic compression stockings.  Past Medical History:  Diagnosis Date  . Abnormal Pap smear   . ADD (attention deficit disorder)   . Depression   . Kidney stones   . UTI (lower urinary tract infection)   . Vaginal Pap smear, abnormal   . Varicose vein of leg    Right leg when standing.    Social History   Tobacco Use  . Smoking status: Former Games developer  . Smokeless tobacco: Never Used  Substance Use Topics  . Alcohol use: Yes    Comment: Rarely    Family History  Adopted: Yes    Allergies  Allergen Reactions  . Latex Itching     Current Outpatient Medications:  .  amphetamine-dextroamphetamine (ADDERALL) 30 MG tablet, Take 30 mg by mouth daily., Disp: , Rfl:  .  rizatriptan (MAXALT) 5 MG tablet, Take 5 mg as needed by mouth for migraine. May repeat in 2 hours if needed, Disp: , Rfl:  .  trimethoprim (TRIMPEX) 100 MG tablet, Take 100 mg by mouth daily., Disp: , Rfl:  .  atomoxetine (STRATTERA) 60 MG capsule, Take 60 mg daily by mouth., Disp: , Rfl:  .  cyclobenzaprine (FLEXERIL) 10 MG tablet, Take 1 tablet (10 mg total) by mouth 3 (three) times daily as needed. (Patient not taking: Reported on 10/25/2015), Disp: 21 tablet, Rfl: 0 .  HYDROcodone-acetaminophen (NORCO/VICODIN) 5-325 MG per tablet, Take one-two tabs po q 4-6 hrs prn pain (Patient not taking: Reported on 10/25/2015), Disp: 20 tablet, Rfl: 0 .   loratadine (CLARITIN) 10 MG tablet, Take 10 mg by mouth daily., Disp: , Rfl:  .  nitrofurantoin (MACRODANTIN) 50 MG capsule, Take 50 mg 4 (four) times daily by mouth., Disp: , Rfl:  .  predniSONE (DELTASONE) 10 MG tablet, Take 6 tablets day one, 5 tablets day two, 4 tablets day three, 3 tablets day four, 2 tablets day five, then 1 tablet day six (Patient not taking: Reported on 10/25/2015), Disp: 21 tablet, Rfl: 0  Vitals:   02/05/17 1414  BP: 111/70  Pulse: 82  Resp: 14  Temp: 98.4 F (36.9 C)  TempSrc: Oral  Weight: 124 lb (56.2 kg)  Height: 5\' 6"  (1.676 m)    Body mass index is 20.01 kg/m.         Review of Systems denies chest pain, dyspnea on exertion, PND, orthopnea, hemoptysis, claudication-negative for hypertension diabetes mellitus    Objective:   Physical Exam BP 111/70 (BP Location: Left Arm, Patient Position: Sitting, Cuff Size: Normal)   Pulse 82   Temp 98.4 F (36.9 C) (Oral)   Resp 14   Ht 5\' 6"  (1.676 m)   Wt 124 lb (56.2 kg)   BMI 20.01 kg/m     Gen.-alert and oriented x3 in no apparent distress HEENT normal for age Lungs no rhonchi or wheezing Cardiovascular regular rhythm no murmurs carotid pulses 3+ palpable no  bruits audible Abdomen soft nontender no palpable masses Musculoskeletal free of  major deformities Skin clear -no rashes Neurologic normal Lower extremities 3+ femoral and dorsalis pedis pulses palpable bilaterally with no edema Right leg with bulging varicosities beginning in the upper to mid third of the thigh extending anterolaterally down lateral to the knee into the lateral calf and other varicosities on the medial aspect over the great saphenous vein below the knee. No hyperpigmentation or ulceration noted.  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

## 2017-02-06 NOTE — Addendum Note (Signed)
Addended by: Burton ApleyPETTY, Aubria Vanecek A on: 02/06/2017 11:01 AM   Modules accepted: Orders

## 2017-05-14 ENCOUNTER — Encounter (HOSPITAL_COMMUNITY): Payer: BLUE CROSS/BLUE SHIELD

## 2017-05-14 ENCOUNTER — Ambulatory Visit: Payer: BLUE CROSS/BLUE SHIELD | Admitting: Vascular Surgery

## 2017-05-20 ENCOUNTER — Other Ambulatory Visit: Payer: Self-pay

## 2017-05-20 ENCOUNTER — Ambulatory Visit (HOSPITAL_COMMUNITY)
Admission: RE | Admit: 2017-05-20 | Discharge: 2017-05-20 | Disposition: A | Discharge status: Home / Self Care | Payer: BLUE CROSS/BLUE SHIELD | Source: Ambulatory Visit | Attending: Vascular Surgery | Admitting: Vascular Surgery

## 2017-05-20 ENCOUNTER — Encounter: Payer: Self-pay | Admitting: Vascular Surgery

## 2017-05-20 ENCOUNTER — Ambulatory Visit (INDEPENDENT_AMBULATORY_CARE_PROVIDER_SITE_OTHER): Payer: BLUE CROSS/BLUE SHIELD | Admitting: Vascular Surgery

## 2017-05-20 VITALS — BP 95/63 | HR 74 | Resp 18 | Ht 66.0 in | Wt 120.0 lb

## 2017-05-20 DIAGNOSIS — M7989 Other specified soft tissue disorders: Secondary | ICD-10-CM | POA: Diagnosis present

## 2017-05-20 DIAGNOSIS — I872 Venous insufficiency (chronic) (peripheral): Secondary | ICD-10-CM | POA: Insufficient documentation

## 2017-05-20 DIAGNOSIS — I83891 Varicose veins of right lower extremities with other complications: Secondary | ICD-10-CM

## 2017-05-20 DIAGNOSIS — I83893 Varicose veins of bilateral lower extremities with other complications: Secondary | ICD-10-CM | POA: Diagnosis not present

## 2017-05-20 NOTE — Progress Notes (Signed)
Subjective:     Patient ID: Laura Kemp, female   DOB: 1980-10-10, 37 y.o.   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  She also will develop some swelling in the right ankle as the day progresses.  This is affecting her daily living and ability to work.  She would like treatment.  She has no history of DVT thrombophlebitis stasis ulcers or bleeding.  Past Medical History:  Diagnosis Date  . Abnormal Pap smear   . ADD (attention deficit disorder)   . Depression   . Kidney stones   . UTI (lower urinary tract infection)   . Vaginal Pap smear, abnormal   . Varicose vein of leg    Right leg when standing.    Social History   Tobacco Use  . Smoking status: Former Games developer  . Smokeless tobacco: Never Used  Substance Use Topics  . Alcohol use: Yes    Comment: Rarely    Family History  Adopted: Yes    Allergies  Allergen Reactions  . Latex Itching     Current Outpatient Medications:  .  amphetamine-dextroamphetamine (ADDERALL) 30 MG tablet, Take 30 mg by mouth daily., Disp: , Rfl:  .  loratadine (CLARITIN) 10 MG tablet, Take 10 mg by mouth daily., Disp: , Rfl:  .  rizatriptan (MAXALT) 5 MG tablet, Take 5 mg as needed by mouth for migraine. May repeat in 2 hours if needed, Disp: , Rfl:  .  trimethoprim (TRIMPEX) 100 MG tablet, Take 100 mg by mouth daily., Disp: , Rfl:  .  atomoxetine (STRATTERA) 60 MG capsule, Take 60 mg daily by mouth., Disp: , Rfl:  .  cyclobenzaprine (FLEXERIL) 10 MG tablet, Take 1 tablet (10 mg total) by mouth 3 (three) times daily as needed. (Patient not taking: Reported on 10/25/2015), Disp: 21 tablet, Rfl: 0 .   HYDROcodone-acetaminophen (NORCO/VICODIN) 5-325 MG per tablet, Take one-two tabs po q 4-6 hrs prn pain (Patient not taking: Reported on 10/25/2015), Disp: 20 tablet, Rfl: 0 .  nitrofurantoin (MACRODANTIN) 50 MG capsule, Take 50 mg 4 (four) times daily by mouth., Disp: , Rfl:  .  predniSONE (DELTASONE) 10 MG tablet, Take 6 tablets day one, 5 tablets day two, 4 tablets day three, 3 tablets day four, 2 tablets day five, then 1 tablet day six (Patient not taking: Reported on 10/25/2015), Disp: 21 tablet, Rfl: 0  Vitals:   05/20/17 1416  BP: 95/63  Pulse: 74  Resp: 18  SpO2: 100%  Weight: 120 lb (54.4 kg)  Height: 5\' 6"  (1.676 m)    Body mass index is 19.37 kg/m.         Review of Systems Denies chest pain, dyspnea on exertion, PND, orthopnea, hemoptysis    Objective:   Physical Exam BP 95/63 (BP Location: Left Arm, Patient Position: Sitting, Cuff Size: Normal)   Pulse 74   Resp 18   Ht 5\' 6"  (1.676 m)   Wt 120 lb (54.4 kg)   SpO2 100%   BMI 19.37 kg/m   General well-developed well-nourished female no apparent distress alert and oriented x3 Right leg with prominent bulging varicosities beginning in the mid anterior thigh extending laterally and medially down into  the lateral and medial calf area.  Trace distal edema.  No hyperpigmentation or ulceration noted.  Today ordered a venous duplex exam of the right leg which I reviewed and interpreted and also performed a bedside SonoSite ultrasound exam.  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

## 2017-05-29 ENCOUNTER — Other Ambulatory Visit: Payer: Self-pay | Admitting: *Deleted

## 2017-05-29 DIAGNOSIS — I83891 Varicose veins of right lower extremities with other complications: Secondary | ICD-10-CM

## 2017-07-15 ENCOUNTER — Other Ambulatory Visit: Payer: BLUE CROSS/BLUE SHIELD | Admitting: Vascular Surgery

## 2017-07-22 ENCOUNTER — Ambulatory Visit: Payer: BLUE CROSS/BLUE SHIELD | Admitting: Vascular Surgery

## 2017-07-22 ENCOUNTER — Encounter (HOSPITAL_COMMUNITY): Payer: BLUE CROSS/BLUE SHIELD

## 2017-07-26 ENCOUNTER — Encounter: Payer: Self-pay | Admitting: Vascular Surgery

## 2017-07-26 ENCOUNTER — Ambulatory Visit: Payer: BLUE CROSS/BLUE SHIELD | Admitting: Vascular Surgery

## 2017-07-26 ENCOUNTER — Other Ambulatory Visit: Payer: Self-pay

## 2017-07-26 VITALS — BP 102/73 | HR 95 | Temp 99.2°F | Resp 16 | Ht 66.0 in | Wt 122.0 lb

## 2017-07-26 DIAGNOSIS — I868 Varicose veins of other specified sites: Secondary | ICD-10-CM

## 2017-07-26 DIAGNOSIS — I83891 Varicose veins of right lower extremities with other complications: Secondary | ICD-10-CM

## 2017-07-26 NOTE — Progress Notes (Signed)
     Laser Ablation Procedure    Date: 07/26/2017   Laura Kemp DOB:September 26, 1980  Consent signed: Yes    Surgeon:  Dr. Tawanna Coolerodd Mayvis Agudelo  Procedure: Laser Ablation: right Greater Saphenous Vein  BP 102/73 (BP Location: Left Arm, Patient Position: Sitting, Cuff Size: Normal)   Pulse 95   Temp 99.2 F (37.3 C) (Oral)   Resp 16   Ht 5\' 6"  (1.676 m)   Wt 122 lb (55.3 kg)   SpO2 98%   BMI 19.69 kg/m   Tumescent Anesthesia: 350 cc 0.9% NaCl with 50 cc Lidocaine HCL with 1% Epi and 15 cc 8.4% NaHCO3  Local Anesthesia: 350 cc Lidocaine HCL and NaHCO3 (ratio 2:1)  15 watts continuous mode        Total energy:740   Total time: :49    Stab Phlebectomy: >20 Sites: Thigh and Calf  Patient tolerated procedure well  Notes:   Description of Procedure:  After marking the course of the secondary varicosities, the patient was placed on the operating table in the supine position, and the right leg was prepped and draped in sterile fashion.   Local anesthetic was administered and under ultrasound guidance the saphenous vein was accessed with a micro needle and guide wire; then the mirco puncture sheath was placed.  A guide wire was inserted saphenofemoral junction , followed by a 5 french sheath.  The position of the sheath and then the laser fiber below the junction was confirmed using the ultrasound.  Tumescent anesthesia was administered along the course of the saphenous vein using ultrasound guidance. The patient was placed in Trendelenburg position and protective laser glasses were placed on patient and staff, and the laser was fired at 15 watts continuous mode advancing 1-732mm/second for a total of 740 joules.   For stab phlebectomies, local anesthetic was administered at the previously marked varicosities, and tumescent anesthesia was administered around the vessels.  Greater than 20 stab wounds were made using the tip of an 11 blade. And using the vein hook, the phlebectomies were performed using  a hemostat to avulse the varicosities.  Adequate hemostasis was achieved.     Steri strips were applied to the stab wounds and ABD pads and thigh high compression stockings were applied.  Ace wrap bandages were applied over the phlebectomy sites and at the top of the saphenofemoral junction. Blood loss was less than 15 cc.  The patient ambulated out of the operating room having tolerated the procedure well.  Ablation of right saphenous vein and proximal thigh and phlebectomies throughout her thigh and calf

## 2017-07-29 ENCOUNTER — Encounter: Payer: Self-pay | Admitting: Vascular Surgery

## 2017-08-08 ENCOUNTER — Ambulatory Visit (INDEPENDENT_AMBULATORY_CARE_PROVIDER_SITE_OTHER): Payer: Self-pay | Admitting: Vascular Surgery

## 2017-08-08 ENCOUNTER — Encounter: Payer: Self-pay | Admitting: Vascular Surgery

## 2017-08-08 ENCOUNTER — Other Ambulatory Visit: Payer: Self-pay

## 2017-08-08 ENCOUNTER — Ambulatory Visit (HOSPITAL_COMMUNITY)
Admission: RE | Admit: 2017-08-08 | Discharge: 2017-08-08 | Disposition: A | Discharge status: Home / Self Care | Payer: BLUE CROSS/BLUE SHIELD | Source: Ambulatory Visit | Attending: Vascular Surgery | Admitting: Vascular Surgery

## 2017-08-08 VITALS — BP 96/63 | HR 78 | Temp 97.4°F | Resp 14 | Ht 66.0 in | Wt 122.0 lb

## 2017-08-08 DIAGNOSIS — Z9889 Other specified postprocedural states: Secondary | ICD-10-CM | POA: Insufficient documentation

## 2017-08-08 DIAGNOSIS — I83891 Varicose veins of right lower extremities with other complications: Secondary | ICD-10-CM | POA: Diagnosis not present

## 2017-08-08 NOTE — Progress Notes (Signed)
   Patient name: Laura Kemp MRN: [id number] DOB: August 15, 1980 Sex: female  REASON FOR VISIT: 2-week follow-up from ablation right great saphenous vein and multiple tributary varicosities phlebectomies  HPI: Laura Kemp is a 37 y.o. female here today for follow-up.  Has been compliant with her compression.  She does report significant discomfort at the level of the ablation site.  Minimal discomfort over the phlebectomies.  Current Outpatient Medications  Medication Sig Dispense Refill  . amphetamine-dextroamphetamine (ADDERALL) 30 MG tablet Take 30 mg by mouth daily.    Marland Kitchen. atomoxetine (STRATTERA) 60 MG capsule Take 60 mg daily by mouth.    . celecoxib (CELEBREX) 200 MG capsule TK 1 C PO D PRN  1  . cyclobenzaprine (FLEXERIL) 10 MG tablet Take 1 tablet (10 mg total) by mouth 3 (three) times daily as needed. 21 tablet 0  . diclofenac (VOLTAREN) 75 MG EC tablet TK 1 T PO BID FOR 7 DAYS  0  . loratadine (CLARITIN) 10 MG tablet Take 10 mg by mouth daily.    . nitrofurantoin (MACRODANTIN) 50 MG capsule Take 50 mg 4 (four) times daily by mouth.    . rizatriptan (MAXALT) 5 MG tablet Take 5 mg as needed by mouth for migraine. May repeat in 2 hours if needed    . trimethoprim (TRIMPEX) 100 MG tablet Take 100 mg by mouth daily.    Marland Kitchen. HYDROcodone-acetaminophen (NORCO/VICODIN) 5-325 MG per tablet Take one-two tabs po q 4-6 hrs prn pain (Patient not taking: Reported on 08/08/2017) 20 tablet 0  . predniSONE (DELTASONE) 10 MG tablet Take 6 tablets day one, 5 tablets day two, 4 tablets day three, 3 tablets day four, 2 tablets day five, then 1 tablet day six (Patient not taking: Reported on 08/08/2017) 21 tablet 0   No current facility-administered medications for this visit.      PHYSICAL EXAM: Vitals:   08/08/17 1015  BP: 96/63  Pulse: 78  Resp: 14  Temp: (!) 97.4 F (36.3 C)  TempSrc: Oral  SpO2: 100%  Weight: 122 lb (55.3 kg)  Height: 5\' 6"  (1.676 m)     GENERAL: The patient is a well-nourished female, in no acute distress. The vital signs are documented above. Her phlebectomy sites look quite good and bruising is completely resolved  Duplex shows closure of her great saphenous vein with no evidence of DVT  MEDICAL ISSUES: Sessile ablation great saphenous vein.  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  She will see us again on an as-needed basis   Larina Earthlyodd F. Aniah Pauli, MD [address] [facility] Office Tel [phone] Pager [phone]

## 2018-03-06 ENCOUNTER — Encounter: Payer: Self-pay | Admitting: Cardiology

## 2018-03-06 NOTE — Progress Notes (Deleted)
Cardiology Office Note  Date: 03/06/2018   ID: Laura Kemp, DOB 05/28/1980, MRN [id number]  PCP: Assunta Found, MD  Consulting Cardiologist: Nona Dell, MD   No chief complaint on file.   History of Present Illness: Laura Kemp is a 38 y.o. female referred for cardiology consultation by Dr. Phillips Odor for evaluation of palpitations.  Past Medical History:  Diagnosis Date  . Abnormal Pap smear   . ADD (attention deficit disorder)   . Depression   . Kidney stones   . UTI (lower urinary tract infection)   . Varicose vein of leg    Right leg when standing.    Past Surgical History:  Procedure Laterality Date  . COLPOSCOPY      Current Outpatient Medications  Medication Sig Dispense Refill  . amphetamine-dextroamphetamine (ADDERALL) 30 MG tablet Take 30 mg by mouth daily.    Marland Kitchen atomoxetine (STRATTERA) 60 MG capsule Take 60 mg daily by mouth.    . celecoxib (CELEBREX) 200 MG capsule TK 1 C PO D PRN  1  . cyclobenzaprine (FLEXERIL) 10 MG tablet Take 1 tablet (10 mg total) by mouth 3 (three) times daily as needed. 21 tablet 0  . diclofenac (VOLTAREN) 75 MG EC tablet TK 1 T PO BID FOR 7 DAYS  0  . HYDROcodone-acetaminophen (NORCO/VICODIN) 5-325 MG per tablet Take one-two tabs po q 4-6 hrs prn pain (Patient not taking: Reported on 08/08/2017) 20 tablet 0  . loratadine (CLARITIN) 10 MG tablet Take 10 mg by mouth daily.    . nitrofurantoin (MACRODANTIN) 50 MG capsule Take 50 mg 4 (four) times daily by mouth.    . predniSONE (DELTASONE) 10 MG tablet Take 6 tablets day one, 5 tablets day two, 4 tablets day three, 3 tablets day four, 2 tablets day five, then 1 tablet day six (Patient not taking: Reported on 08/08/2017) 21 tablet 0  . rizatriptan (MAXALT) 5 MG tablet Take 5 mg as needed by mouth for migraine. May repeat in 2 hours if needed    . trimethoprim (TRIMPEX) 100 MG tablet Take 100 mg by mouth daily.     No current facility-administered medications for this visit.     Allergies:  Latex   Social History: The patient  reports that she has been smoking. She has never used smokeless tobacco. She reports current alcohol use. She reports that she does not use drugs.   Family History: The patient's family history is not on file. She was adopted.   ROS:  Please see the history of present illness. Otherwise, complete review of systems is positive for {NONE DEFAULTED:18576::"none"}.  All other systems are reviewed and negative.   Physical Exam: VS:  There were no vitals taken for this visit., BMI There is no height or weight on file to calculate BMI.  Wt Readings from Last 3 Encounters:  08/08/17 122 lb (55.3 kg)  07/26/17 122 lb (55.3 kg)  05/20/17 120 lb (54.4 kg)    General: Patient appears comfortable at rest. HEENT: Conjunctiva and lids normal, oropharynx clear with moist mucosa. Neck: Supple, no elevated JVP or carotid bruits, no thyromegaly. Lungs: Clear to auscultation, nonlabored breathing at rest. Cardiac: Regular rate and rhythm, no S3 or significant systolic murmur, no pericardial rub. Abdomen: Soft, nontender, no hepatomegaly, bowel sounds present, no guarding or rebound. Extremities: No pitting edema, distal pulses 2+. Skin: Warm and dry. Musculoskeletal: No kyphosis. Neuropsychiatric: Alert and oriented x3, affect grossly appropriate.  ECG: No old tracing available for comparison.  Recent Labwork:   Other Studies Reviewed Today:   Assessment and Plan:   Current medicines were reviewed with the patient today.  No orders of the defined types were placed in this encounter.   Disposition:  Signed, Jonelle SidleSamuel G. Macyn Shropshire, MD, [facility] 03/06/2018 12:37 PM    [facility] [address], [place], [place] [address] Phone: [phone]; Fax: [phone]

## 2018-03-10 ENCOUNTER — Ambulatory Visit: Payer: BLUE CROSS/BLUE SHIELD | Admitting: Cardiology

## 2018-03-11 ENCOUNTER — Encounter: Payer: Self-pay | Admitting: Cardiology

## 2020-12-14 ENCOUNTER — Other Ambulatory Visit: Payer: Self-pay

## 2020-12-14 ENCOUNTER — Encounter: Payer: Self-pay | Admitting: Behavioral Health

## 2020-12-14 ENCOUNTER — Ambulatory Visit (INDEPENDENT_AMBULATORY_CARE_PROVIDER_SITE_OTHER): Payer: 59 | Admitting: Behavioral Health

## 2020-12-14 VITALS — BP 107/69 | HR 101 | Ht 66.0 in | Wt 131.0 lb

## 2020-12-14 DIAGNOSIS — F5105 Insomnia due to other mental disorder: Secondary | ICD-10-CM | POA: Diagnosis not present

## 2020-12-14 DIAGNOSIS — F331 Major depressive disorder, recurrent, moderate: Secondary | ICD-10-CM | POA: Diagnosis not present

## 2020-12-14 DIAGNOSIS — F411 Generalized anxiety disorder: Secondary | ICD-10-CM | POA: Diagnosis not present

## 2020-12-14 DIAGNOSIS — F39 Unspecified mood [affective] disorder: Secondary | ICD-10-CM

## 2020-12-14 DIAGNOSIS — F900 Attention-deficit hyperactivity disorder, predominantly inattentive type: Secondary | ICD-10-CM

## 2020-12-14 DIAGNOSIS — F99 Mental disorder, not otherwise specified: Secondary | ICD-10-CM

## 2020-12-14 MED ORDER — AMPHETAMINE-DEXTROAMPHET ER 30 MG PO CP24: 30.0000 mg | ORAL_CAPSULE | Freq: Every day | ORAL | 0 refills | Status: DC

## 2020-12-14 MED ORDER — ZOLPIDEM TARTRATE ER 12.5 MG PO TBCR: 12.5000 mg | EXTENDED_RELEASE_TABLET | Freq: Every day | ORAL | 1 refills | Status: DC

## 2020-12-14 MED ORDER — VILAZODONE HCL 20 MG PO TABS: 20.0000 mg | ORAL_TABLET | Freq: Every day | ORAL | 1 refills | Status: DC
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
## 2020-12-26 ENCOUNTER — Telehealth: Payer: Self-pay | Admitting: Behavioral Health

## 2020-12-26 ENCOUNTER — Other Ambulatory Visit: Payer: Self-pay | Admitting: Behavioral Health

## 2020-12-26 NOTE — Telephone Encounter (Signed)
Pt is on Adderall XR 30mg  but feels like the med wears off around  2-3 pm. Can we adjust dose? [facility] [address], [place].

## 2020-12-26 NOTE — Telephone Encounter (Signed)
Please review

## 2020-12-26 NOTE — Telephone Encounter (Signed)
LVM with info

## 2020-12-26 NOTE — Telephone Encounter (Signed)
She just filled script on 11/16.  I cannot increase or give her another fill until she comes in next time and sees me. Her next appt is on 12/14 which is around the time of her next eligible fill,  and we can talk about it then. Please advise her.

## 2021-01-11 ENCOUNTER — Other Ambulatory Visit: Payer: Self-pay

## 2021-01-11 ENCOUNTER — Ambulatory Visit (INDEPENDENT_AMBULATORY_CARE_PROVIDER_SITE_OTHER): Payer: 59 | Admitting: Behavioral Health

## 2021-01-11 ENCOUNTER — Telehealth: Payer: Self-pay | Admitting: Behavioral Health

## 2021-01-11 ENCOUNTER — Encounter: Payer: Self-pay | Admitting: Behavioral Health

## 2021-01-11 DIAGNOSIS — F99 Mental disorder, not otherwise specified: Secondary | ICD-10-CM

## 2021-01-11 DIAGNOSIS — F331 Major depressive disorder, recurrent, moderate: Secondary | ICD-10-CM | POA: Diagnosis not present

## 2021-01-11 DIAGNOSIS — F39 Unspecified mood [affective] disorder: Secondary | ICD-10-CM

## 2021-01-11 DIAGNOSIS — F411 Generalized anxiety disorder: Secondary | ICD-10-CM | POA: Diagnosis not present

## 2021-01-11 DIAGNOSIS — F900 Attention-deficit hyperactivity disorder, predominantly inattentive type: Secondary | ICD-10-CM

## 2021-01-11 DIAGNOSIS — F309 Manic episode, unspecified: Secondary | ICD-10-CM

## 2021-01-11 DIAGNOSIS — F5105 Insomnia due to other mental disorder: Secondary | ICD-10-CM

## 2021-01-11 MED ORDER — LAMOTRIGINE 25 MG PO TABS: ORAL_TABLET | ORAL | 1 refills | Status: DC

## 2021-01-11 MED ORDER — VILAZODONE HCL 20 MG PO TABS: 20.0000 mg | ORAL_TABLET | Freq: Every day | ORAL | 2 refills | Status: DC

## 2021-01-11 MED ORDER — AMPHETAMINE-DEXTROAMPHETAMINE 10 MG PO TABS: 10.0000 mg | ORAL_TABLET | Freq: Every day | ORAL | 0 refills | Status: DC

## 2021-01-11 MED ORDER — AMPHETAMINE-DEXTROAMPHETAMINE 10 MG PO TABS
10.0000 mg | ORAL_TABLET | Freq: Every day | ORAL | 0 refills | Status: DC
Start: 2021-01-11 — End: 2021-01-11

## 2021-01-11 MED ORDER — AMPHETAMINE-DEXTROAMPHET ER 30 MG PO CP24
30.0000 mg | ORAL_CAPSULE | Freq: Every day | ORAL | 0 refills | Status: DC
Start: 2021-01-11 — End: 2021-02-08

## 2021-01-11 NOTE — Telephone Encounter (Signed)
Done. Thank you.
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
## 2021-01-11 NOTE — Telephone Encounter (Signed)
Pt called reporting [facility] is out of Adderall 10 mg. Please send to [facility] in [place] per Pt. Contact # if needed [phone]

## 2021-02-08 ENCOUNTER — Ambulatory Visit (INDEPENDENT_AMBULATORY_CARE_PROVIDER_SITE_OTHER): Payer: 59 | Admitting: Behavioral Health

## 2021-02-08 ENCOUNTER — Encounter: Payer: Self-pay | Admitting: Behavioral Health

## 2021-02-08 ENCOUNTER — Other Ambulatory Visit: Payer: Self-pay

## 2021-02-08 DIAGNOSIS — F331 Major depressive disorder, recurrent, moderate: Secondary | ICD-10-CM

## 2021-02-08 DIAGNOSIS — F900 Attention-deficit hyperactivity disorder, predominantly inattentive type: Secondary | ICD-10-CM

## 2021-02-08 DIAGNOSIS — F99 Mental disorder, not otherwise specified: Secondary | ICD-10-CM

## 2021-02-08 DIAGNOSIS — F39 Unspecified mood [affective] disorder: Secondary | ICD-10-CM

## 2021-02-08 DIAGNOSIS — F411 Generalized anxiety disorder: Secondary | ICD-10-CM

## 2021-02-08 DIAGNOSIS — F5105 Insomnia due to other mental disorder: Secondary | ICD-10-CM

## 2021-02-08 MED ORDER — AMPHETAMINE-DEXTROAMPHETAMINE 10 MG PO TABS: 10.0000 mg | ORAL_TABLET | Freq: Every day | ORAL | 0 refills | Status: DC

## 2021-02-08 MED ORDER — ZOLPIDEM TARTRATE ER 12.5 MG PO TBCR: 12.5000 mg | EXTENDED_RELEASE_TABLET | Freq: Every day | ORAL | 1 refills | Status: DC

## 2021-02-08 MED ORDER — LAMOTRIGINE 100 MG PO TABS: 100.0000 mg | ORAL_TABLET | Freq: Every day | ORAL | 3 refills | Status: AC

## 2021-02-08 MED ORDER — VILAZODONE HCL 20 MG PO TABS: 20.0000 mg | ORAL_TABLET | Freq: Every day | ORAL | 2 refills | Status: DC

## 2021-02-08 MED ORDER — AMPHETAMINE-DEXTROAMPHET ER 30 MG PO CP24: 30.0000 mg | ORAL_CAPSULE | Freq: Every day | ORAL | 0 refills | Status: AC
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
## 2021-03-08 ENCOUNTER — Ambulatory Visit: Payer: Self-pay | Admitting: Behavioral Health

## 2021-03-21 ENCOUNTER — Other Ambulatory Visit: Payer: Self-pay

## 2021-03-21 ENCOUNTER — Telehealth: Payer: Self-pay | Admitting: Behavioral Health

## 2021-03-21 DIAGNOSIS — F900 Attention-deficit hyperactivity disorder, predominantly inattentive type: Secondary | ICD-10-CM

## 2021-03-21 MED ORDER — AMPHETAMINE-DEXTROAMPHETAMINE 10 MG PO TABS: 10.0000 mg | ORAL_TABLET | Freq: Every day | ORAL | 0 refills | Status: AC

## 2021-03-21 NOTE — Telephone Encounter (Signed)
Pended to Dr. Clovis Pu in Brian's absence.

## 2021-03-21 NOTE — Telephone Encounter (Signed)
Pt requesting Rx for Adderall 10mg  to . Apt 3/13

## 2021-04-10 ENCOUNTER — Encounter: Payer: Self-pay | Admitting: Behavioral Health

## 2021-04-10 ENCOUNTER — Ambulatory Visit (INDEPENDENT_AMBULATORY_CARE_PROVIDER_SITE_OTHER): Payer: 59 | Admitting: Behavioral Health

## 2021-04-10 ENCOUNTER — Other Ambulatory Visit: Payer: Self-pay

## 2021-04-10 DIAGNOSIS — R69 Illness, unspecified: Secondary | ICD-10-CM | POA: Diagnosis not present

## 2021-04-10 DIAGNOSIS — F331 Major depressive disorder, recurrent, moderate: Secondary | ICD-10-CM | POA: Diagnosis not present

## 2021-04-10 DIAGNOSIS — F39 Unspecified mood [affective] disorder: Secondary | ICD-10-CM | POA: Diagnosis not present

## 2021-04-10 DIAGNOSIS — F99 Mental disorder, not otherwise specified: Secondary | ICD-10-CM | POA: Diagnosis not present

## 2021-04-10 DIAGNOSIS — F5105 Insomnia due to other mental disorder: Secondary | ICD-10-CM

## 2021-04-10 DIAGNOSIS — F900 Attention-deficit hyperactivity disorder, predominantly inattentive type: Secondary | ICD-10-CM

## 2021-04-10 DIAGNOSIS — F411 Generalized anxiety disorder: Secondary | ICD-10-CM | POA: Diagnosis not present

## 2021-04-10 MED ORDER — AMPHETAMINE-DEXTROAMPHETAMINE 20 MG PO TABS: 20.0000 mg | ORAL_TABLET | Freq: Two times a day (BID) | ORAL | 0 refills | Status: DC

## 2021-04-10 MED ORDER — ZOLPIDEM TARTRATE ER 12.5 MG PO TBCR: 12.5000 mg | EXTENDED_RELEASE_TABLET | Freq: Every day | ORAL | 1 refills | Status: AC
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
## 2021-04-11 ENCOUNTER — Other Ambulatory Visit: Payer: Self-pay | Admitting: Behavioral Health

## 2021-04-11 MED ORDER — AMPHETAMINE-DEXTROAMPHETAMINE 20 MG PO TABS: 20.0000 mg | ORAL_TABLET | Freq: Two times a day (BID) | ORAL | 0 refills | Status: AC

## 2021-04-11 NOTE — Telephone Encounter (Signed)
Pt called before lunch reporting Adderall send to [facility] yesterday. They are out. Please send to [facility] [address] ASAP.  ?

## 2021-04-21 ENCOUNTER — Other Ambulatory Visit: Payer: Self-pay

## 2021-04-21 DIAGNOSIS — I83891 Varicose veins of right lower extremities with other complications: Secondary | ICD-10-CM

## 2021-04-27 ENCOUNTER — Encounter (HOSPITAL_COMMUNITY): Payer: BLUE CROSS/BLUE SHIELD

## 2021-04-27 ENCOUNTER — Ambulatory Visit: Payer: BLUE CROSS/BLUE SHIELD

## 2021-05-15 ENCOUNTER — Other Ambulatory Visit: Payer: Self-pay | Admitting: Behavioral Health

## 2021-05-15 ENCOUNTER — Telehealth: Payer: Self-pay | Admitting: Behavioral Health

## 2021-05-15 DIAGNOSIS — F331 Major depressive disorder, recurrent, moderate: Secondary | ICD-10-CM

## 2021-05-15 DIAGNOSIS — F411 Generalized anxiety disorder: Secondary | ICD-10-CM

## 2021-05-15 DIAGNOSIS — F39 Unspecified mood [affective] disorder: Secondary | ICD-10-CM

## 2021-05-15 DIAGNOSIS — F900 Attention-deficit hyperactivity disorder, predominantly inattentive type: Secondary | ICD-10-CM

## 2021-05-15 DIAGNOSIS — F309 Manic episode, unspecified: Secondary | ICD-10-CM

## 2021-05-15 DIAGNOSIS — F99 Mental disorder, not otherwise specified: Secondary | ICD-10-CM

## 2021-05-15 MED ORDER — AMPHETAMINE-DEXTROAMPHETAMINE 20 MG PO TABS: 20.0000 mg | ORAL_TABLET | Freq: Two times a day (BID) | ORAL | 0 refills | Status: AC

## 2021-05-15 NOTE — Telephone Encounter (Signed)
Lauri's next visit is 06/12/21. She called requesting a refill on her Adderall 20 mg #60. She checked with [facility] and they do have it in stock. Pharmacy is: ? ?[facility] [id number] - [place], [place] - [address] ? ?Phone:  [phone]  ?Fax:  [phone]  ? ? ? ?

## 2021-05-15 NOTE — Telephone Encounter (Signed)
Rx sent to [facility] on [address] [place]

## 2021-06-12 ENCOUNTER — Ambulatory Visit (INDEPENDENT_AMBULATORY_CARE_PROVIDER_SITE_OTHER): Payer: Self-pay | Admitting: Behavioral Health

## 2021-06-12 DIAGNOSIS — F489 Nonpsychotic mental disorder, unspecified: Secondary | ICD-10-CM

## 2021-06-12 NOTE — Progress Notes (Signed)
Pt did not show for scheduled appointment and did not provide 24 hr notice as required. Fees assessed.  

## 2021-07-06 DIAGNOSIS — I8312 Varicose veins of left lower extremity with inflammation: Secondary | ICD-10-CM | POA: Diagnosis not present

## 2021-07-06 DIAGNOSIS — K900 Celiac disease: Secondary | ICD-10-CM | POA: Diagnosis not present

## 2021-07-06 DIAGNOSIS — R69 Illness, unspecified: Secondary | ICD-10-CM | POA: Diagnosis not present

## 2021-07-06 DIAGNOSIS — Z6821 Body mass index (BMI) 21.0-21.9, adult: Secondary | ICD-10-CM | POA: Diagnosis not present

## 2021-07-06 DIAGNOSIS — E538 Deficiency of other specified B group vitamins: Secondary | ICD-10-CM | POA: Diagnosis not present

## 2021-07-06 DIAGNOSIS — I8311 Varicose veins of right lower extremity with inflammation: Secondary | ICD-10-CM | POA: Diagnosis not present

## 2021-10-17 ENCOUNTER — Other Ambulatory Visit: Payer: Self-pay | Admitting: Behavioral Health

## 2021-10-17 DIAGNOSIS — F5105 Insomnia due to other mental disorder: Secondary | ICD-10-CM

## 2021-10-17 NOTE — Telephone Encounter (Signed)
Please schedule appt

## 2021-10-27 DIAGNOSIS — R69 Illness, unspecified: Secondary | ICD-10-CM | POA: Diagnosis not present

## 2021-12-29 NOTE — Telephone Encounter (Signed)
LVM for pt to schedule appt 

## 2022-03-16 DIAGNOSIS — Z01419 Encounter for gynecological examination (general) (routine) without abnormal findings: Secondary | ICD-10-CM | POA: Diagnosis not present

## 2022-03-16 DIAGNOSIS — Z124 Encounter for screening for malignant neoplasm of cervix: Secondary | ICD-10-CM | POA: Diagnosis not present

## 2022-03-16 DIAGNOSIS — R5383 Other fatigue: Secondary | ICD-10-CM | POA: Diagnosis not present

## 2022-03-16 DIAGNOSIS — R69 Illness, unspecified: Secondary | ICD-10-CM | POA: Diagnosis not present

## 2022-03-29 DIAGNOSIS — I83893 Varicose veins of bilateral lower extremities with other complications: Secondary | ICD-10-CM | POA: Diagnosis not present

## 2022-03-29 DIAGNOSIS — I87393 Chronic venous hypertension (idiopathic) with other complications of bilateral lower extremity: Secondary | ICD-10-CM | POA: Diagnosis not present

## 2022-04-26 DIAGNOSIS — I872 Venous insufficiency (chronic) (peripheral): Secondary | ICD-10-CM | POA: Diagnosis not present

## 2022-05-03 DIAGNOSIS — I872 Venous insufficiency (chronic) (peripheral): Secondary | ICD-10-CM | POA: Diagnosis not present

## 2022-05-17 DIAGNOSIS — Z09 Encounter for follow-up examination after completed treatment for conditions other than malignant neoplasm: Secondary | ICD-10-CM | POA: Diagnosis not present

## 2022-05-17 DIAGNOSIS — I872 Venous insufficiency (chronic) (peripheral): Secondary | ICD-10-CM | POA: Diagnosis not present

## 2022-05-17 DIAGNOSIS — M7989 Other specified soft tissue disorders: Secondary | ICD-10-CM | POA: Diagnosis not present

## 2022-05-31 DIAGNOSIS — I872 Venous insufficiency (chronic) (peripheral): Secondary | ICD-10-CM | POA: Diagnosis not present

## 2022-06-29 DIAGNOSIS — F39 Unspecified mood [affective] disorder: Secondary | ICD-10-CM | POA: Diagnosis not present

## 2022-06-29 DIAGNOSIS — F5104 Psychophysiologic insomnia: Secondary | ICD-10-CM | POA: Diagnosis not present

## 2022-06-29 DIAGNOSIS — I8312 Varicose veins of left lower extremity with inflammation: Secondary | ICD-10-CM | POA: Diagnosis not present

## 2022-06-29 DIAGNOSIS — I8311 Varicose veins of right lower extremity with inflammation: Secondary | ICD-10-CM | POA: Diagnosis not present

## 2022-06-29 DIAGNOSIS — E538 Deficiency of other specified B group vitamins: Secondary | ICD-10-CM | POA: Diagnosis not present

## 2022-06-29 DIAGNOSIS — E559 Vitamin D deficiency, unspecified: Secondary | ICD-10-CM | POA: Diagnosis not present

## 2022-06-29 DIAGNOSIS — F909 Attention-deficit hyperactivity disorder, unspecified type: Secondary | ICD-10-CM | POA: Diagnosis not present

## 2022-06-29 DIAGNOSIS — Z681 Body mass index (BMI) 19 or less, adult: Secondary | ICD-10-CM | POA: Diagnosis not present

## 2022-09-14 DIAGNOSIS — F5104 Psychophysiologic insomnia: Secondary | ICD-10-CM | POA: Diagnosis not present

## 2022-09-14 DIAGNOSIS — Z6820 Body mass index (BMI) 20.0-20.9, adult: Secondary | ICD-10-CM | POA: Diagnosis not present

## 2022-09-14 DIAGNOSIS — J0100 Acute maxillary sinusitis, unspecified: Secondary | ICD-10-CM | POA: Diagnosis not present

## 2022-09-14 DIAGNOSIS — I742 Embolism and thrombosis of arteries of the upper extremities: Secondary | ICD-10-CM | POA: Diagnosis not present

## 2022-09-14 DIAGNOSIS — F909 Attention-deficit hyperactivity disorder, unspecified type: Secondary | ICD-10-CM | POA: Diagnosis not present

## 2022-11-19 DIAGNOSIS — F909 Attention-deficit hyperactivity disorder, unspecified type: Secondary | ICD-10-CM | POA: Diagnosis not present

## 2022-11-19 DIAGNOSIS — E538 Deficiency of other specified B group vitamins: Secondary | ICD-10-CM | POA: Diagnosis not present

## 2022-11-19 DIAGNOSIS — Z681 Body mass index (BMI) 19 or less, adult: Secondary | ICD-10-CM | POA: Diagnosis not present

## 2022-11-19 DIAGNOSIS — R002 Palpitations: Secondary | ICD-10-CM | POA: Diagnosis not present

## 2022-11-19 DIAGNOSIS — I4719 Other supraventricular tachycardia: Secondary | ICD-10-CM | POA: Diagnosis not present

## 2022-11-19 DIAGNOSIS — M5442 Lumbago with sciatica, left side: Secondary | ICD-10-CM | POA: Diagnosis not present

## 2022-11-19 DIAGNOSIS — E559 Vitamin D deficiency, unspecified: Secondary | ICD-10-CM | POA: Diagnosis not present

## 2022-11-19 DIAGNOSIS — F5104 Psychophysiologic insomnia: Secondary | ICD-10-CM | POA: Diagnosis not present

## 2022-12-26 ENCOUNTER — Ambulatory Visit: Payer: 59 | Attending: Internal Medicine | Admitting: Internal Medicine

## 2022-12-26 ENCOUNTER — Ambulatory Visit (INDEPENDENT_AMBULATORY_CARE_PROVIDER_SITE_OTHER): Payer: 59

## 2022-12-26 ENCOUNTER — Encounter: Payer: Self-pay | Admitting: Internal Medicine

## 2022-12-26 VITALS — BP 112/88 | HR 97 | Ht 66.0 in | Wt 118.2 lb

## 2022-12-26 DIAGNOSIS — Z136 Encounter for screening for cardiovascular disorders: Secondary | ICD-10-CM

## 2022-12-26 DIAGNOSIS — R002 Palpitations: Secondary | ICD-10-CM | POA: Diagnosis not present

## 2022-12-26 DIAGNOSIS — R000 Tachycardia, unspecified: Secondary | ICD-10-CM

## 2022-12-26 DIAGNOSIS — R079 Chest pain, unspecified: Secondary | ICD-10-CM | POA: Insufficient documentation

## 2022-12-26 DIAGNOSIS — R0602 Shortness of breath: Secondary | ICD-10-CM | POA: Diagnosis not present

## 2022-12-26 NOTE — Patient Instructions (Signed)
Medication Instructions:  Your physician recommends that you continue on your current medications as directed. Please refer to the Current Medication list given to you today.  *If you need a refill on your cardiac medications before your next appointment, please call your pharmacy*   Lab Work: None If you have labs (blood work) drawn today and your tests are completely normal, you will receive your results only by: MyChart Message (if you have MyChart) OR A paper copy in the mail If you have any lab test that is abnormal or we need to change your treatment, we will call you to review the results.   Testing/Procedures: Your physician has requested that you have an echocardiogram. Echocardiography is a painless test that uses sound waves to create images of your heart. It provides your doctor with information about the size and shape of your heart and how well your heart's chambers and valves are working. This procedure takes approximately one hour. There are no restrictions for this procedure. Please do NOT wear cologne, perfume, aftershave, or lotions (deodorant is allowed). Please arrive 15 minutes prior to your appointment time.  Please note: We ask at that you not bring children with you during ultrasound (echo/ vascular) testing. Due to room size and safety concerns, children are not allowed in the ultrasound rooms during exams. Our front office staff cannot provide observation of children in our lobby area while testing is being conducted. An adult accompanying a patient to their appointment will only be allowed in the ultrasound room at the discretion of the ultrasound technician under special circumstances. We apologize for any inconvenience.  Your physician has requested that you have an exercise tolerance test. For further information please visit [email or web address] Please also follow instruction sheet, as given.    Follow-Up: At [facility], you and your health needs  are our priority.  As part of our continuing mission to provide you with exceptional heart care, we have created designated Provider Care Teams.  These Care Teams include your primary Cardiologist (physician) and Advanced Practice Providers (APPs -  Physician Assistants and Nurse Practitioners) who all work together to provide you with the care you need, when you need it.  We recommend signing up for the patient portal called "MyChart".  Sign up information is provided on this After Visit Summary.  MyChart is used to connect with patients for Virtual Visits (Telemedicine).  Patients are able to view lab/test results, encounter notes, upcoming appointments, etc.  Non-urgent messages can be sent to your provider as well.   To learn more about what you can do with MyChart, go to [email or web address].    Your next appointment:   3 month(s)  Provider:   You may see Vishnu P Mallipeddi, MD or one of the following Advanced Practice Providers on your designated Care Team:   [place], PA-C  Jacolyn Reedy, [place]     Other Instructions

## 2022-12-26 NOTE — Progress Notes (Signed)
Cardiology Office Note  Date: 12/26/2022   ID: Laura Kemp, DOB 01-27-1981, MRN [id number]  PCP:  Assunta Found, MD  Cardiologist:  Marjo Bicker, MD Electrophysiologist:  None   History of Present Illness: Laura Kemp is a 42 y.o. female known to have varicose veins of the legs.  Her to cardiology clinic for evaluation of palpitations, chest pain and SOB.  Ongoing palpitations, chest pain and SOB for many years, occurs during anxiety as well as outside the anxiety window.  The symptoms can last from minutes to many hours.  No dizziness, syncope.  She does not exercise.  Drinks 1 can of coffee daily.  Past Medical History:  Diagnosis Date   Abnormal Pap smear    ADD (attention deficit disorder)    Depression    Kidney stones    UTI (lower urinary tract infection)    Varicose vein of leg    Right leg when standing.    Past Surgical History:  Procedure Laterality Date   COLPOSCOPY      Current Outpatient Medications  Medication Sig Dispense Refill   amphetamine-dextroamphetamine (ADDERALL) 20 MG tablet Take 1 tablet (20 mg total) by mouth 2 (two) times daily. 60 tablet 0   escitalopram (LEXAPRO) 10 MG tablet Take 10 mg by mouth daily.     loratadine (CLARITIN) 10 MG tablet Take 10 mg by mouth daily.     zolpidem (AMBIEN CR) 12.5 MG CR tablet Take 1 tablet (12.5 mg total) by mouth at bedtime. 30 tablet 1   amphetamine-dextroamphetamine (ADDERALL XR) 30 MG 24 hr capsule Take 1 capsule (30 mg total) by mouth daily. 30 capsule 0   amphetamine-dextroamphetamine (ADDERALL) 10 MG tablet Take 1 tablet (10 mg total) by mouth daily with breakfast. 30 tablet 0   amphetamine-dextroamphetamine (ADDERALL) 20 MG tablet Take 1 tablet (20 mg total) by mouth 2 (two) times daily. 60 tablet 0   amphetamine-dextroamphetamine (ADDERALL) 30 MG tablet Take 30 mg by mouth daily. (Patient not taking: Reported on 12/26/2022)     atomoxetine (STRATTERA) 60 MG capsule Take 60 mg daily by  mouth. (Patient not taking: Reported on 12/26/2022)     celecoxib (CELEBREX) 200 MG capsule TK 1 C PO D PRN (Patient not taking: Reported on 12/26/2022)  1   cyclobenzaprine (FLEXERIL) 10 MG tablet Take 1 tablet (10 mg total) by mouth 3 (three) times daily as needed. (Patient not taking: Reported on 12/26/2022) 21 tablet 0   diclofenac (VOLTAREN) 75 MG EC tablet TK 1 T PO BID FOR 7 DAYS (Patient not taking: Reported on 12/26/2022)  0   HYDROcodone-acetaminophen (NORCO/VICODIN) 5-325 MG per tablet Take one-two tabs po q 4-6 hrs prn pain (Patient not taking: Reported on 12/26/2022) 20 tablet 0   lamoTRIgine (LAMICTAL) 100 MG tablet Take 1 tablet (100 mg total) by mouth daily. (Patient not taking: Reported on 12/26/2022) 30 tablet 3   nitrofurantoin (MACRODANTIN) 50 MG capsule Take 50 mg 4 (four) times daily by mouth. (Patient not taking: Reported on 12/26/2022)     No current facility-administered medications for this visit.   Allergies:  Latex   Social History: The patient  reports that she has quit smoking. Her smoking use included cigarettes. She has never used smokeless tobacco. She reports current alcohol use. She reports that she does not use drugs.   Family History: The patient's family history is not on file. She was adopted.   ROS:  Please see the history of present illness. Otherwise, complete review  of systems is positive for none.  All other systems are reviewed and negative.   Physical Exam: VS:  BP 112/88 (BP Location: Left Arm, Patient Position: Sitting, Cuff Size: Normal)   Pulse 97   Ht 5\' 6"  (1.676 m)   Wt 118 lb 3.2 oz (53.6 kg)   SpO2 99%   BMI 19.08 kg/m , BMI Body mass index is 19.08 kg/m.  Wt Readings from Last 3 Encounters:  12/26/22 118 lb 3.2 oz (53.6 kg)  08/08/17 122 lb (55.3 kg)  07/26/17 122 lb (55.3 kg)    General: Patient appears comfortable at rest. HEENT: Conjunctiva and lids normal, oropharynx clear with moist mucosa. Neck: Supple, no elevated JVP  or carotid bruits, no thyromegaly. Lungs: Clear to auscultation, nonlabored breathing at rest. Cardiac: Regular rate and rhythm, no S3 or significant systolic murmur, no pericardial rub. Abdomen: Soft, nontender, no hepatomegaly, bowel sounds present, no guarding or rebound. Extremities: No pitting edema, distal pulses 2+. Skin: Warm and dry. Musculoskeletal: No kyphosis. Neuropsychiatric: Alert and oriented x3, affect grossly appropriate.  Recent Labwork: No results found for requested labs within last 365 days.  No results found for: "CHOL", "TRIG", "HDL", "CHOLHDL", "VLDL", "LDLCALC", "LDLDIRECT"   Assessment and Plan:  Chest pain Palpitations Shortness of breath   -Patient has ongoing chronic symptoms of chest pain, palpitations and SOB during anxiety and outside of the anxiety window. These symptoms can last from minutes to hours.  Obtain exercise tolerance test to rule out exertional arrhythmias, echocardiogram to rule out structural heart disease and 2-week event monitor to rule out any arrhythmias.  Encouraged to cut back on caffeine intake.  Management of underlying anxiety.   Medication Adjustments/Labs and Tests Ordered: Current medicines are reviewed at length with the patient today.  Concerns regarding medicines are outlined above.    Disposition:  Follow up pending results  Signed, Codi Folkerts Verne Spurr, MD, 12/26/2022 3:21 PM    [facility] [address] [place], [place] [address]

## 2023-01-02 DIAGNOSIS — R000 Tachycardia, unspecified: Secondary | ICD-10-CM | POA: Diagnosis not present

## 2023-01-02 DIAGNOSIS — R002 Palpitations: Secondary | ICD-10-CM

## 2023-01-28 ENCOUNTER — Ambulatory Visit (HOSPITAL_COMMUNITY): Admission: RE | Admit: 2023-01-28 | Payer: 59 | Source: Ambulatory Visit

## 2023-01-28 ENCOUNTER — Ambulatory Visit (HOSPITAL_COMMUNITY): Payer: 59 | Attending: Family Medicine

## 2023-01-29 DIAGNOSIS — R002 Palpitations: Secondary | ICD-10-CM | POA: Diagnosis not present

## 2023-02-05 DIAGNOSIS — A493 Mycoplasma infection, unspecified site: Secondary | ICD-10-CM | POA: Diagnosis not present

## 2023-02-22 ENCOUNTER — Ambulatory Visit (HOSPITAL_COMMUNITY): Admission: RE | Admit: 2023-02-22 | Payer: 59 | Source: Ambulatory Visit

## 2023-03-26 ENCOUNTER — Telehealth: Payer: Self-pay | Admitting: Internal Medicine

## 2023-03-26 NOTE — Telephone Encounter (Signed)
Pt calling back for monitor results  

## 2023-03-27 ENCOUNTER — Telehealth: Payer: Self-pay | Admitting: *Deleted

## 2023-03-27 MED ORDER — METOPROLOL TARTRATE 25 MG PO TABS: 25.0000 mg | ORAL_TABLET | Freq: Two times a day (BID) | ORAL | 3 refills | Status: AC

## 2023-03-27 NOTE — Telephone Encounter (Signed)
-----   Message from Vishnu P Mallipeddi sent at 03/21/2023  3:05 PM EST ----- Average HR 95 bpm.  Patient's symptoms correlated with NSR (83 to 120 bpm).  No malignant arrhythmias.  Can start metoprolol tartrate 25 mg twice daily for symptomatic relief.  Side effects include hypotension, fatigue.  Hold metoprolol for BP less than 100 mmHg SBP.  Follow-up in 6 months.

## 2023-03-27 NOTE — Telephone Encounter (Signed)
 The patient has been notified of the result and verbalized understanding.  All questions (if any) were answered. Roseanne Reno, [facility] 03/27/2023 8:46 AM

## 2023-03-27 NOTE — Telephone Encounter (Signed)
 The patient has been notified of the result and verbalized understanding.  All questions (if any) were answered. Elesa Massed, LPN 8/65/7846 9:62 AM  Order placed.

## 2023-04-08 ENCOUNTER — Ambulatory Visit: Payer: Self-pay | Attending: Internal Medicine | Admitting: Internal Medicine

## 2023-04-08 NOTE — Progress Notes (Signed)
 Erroneous encounter - please disregard.
